# Patient Record
Sex: Male | Born: 1935 | Race: White | Hispanic: No | Marital: Married | Smoking: Former smoker
Health system: Southern US, Community
[De-identification: ages and names within clinical notes are randomized; demographics above are authoritative.]

## PROBLEM LIST (undated history)

## (undated) DIAGNOSIS — Z9889 Other specified postprocedural states: Secondary | ICD-10-CM

## (undated) DIAGNOSIS — I639 Cerebral infarction, unspecified: Secondary | ICD-10-CM

## (undated) DIAGNOSIS — G459 Transient cerebral ischemic attack, unspecified: Secondary | ICD-10-CM

## (undated) DIAGNOSIS — C439 Malignant melanoma of skin, unspecified: Secondary | ICD-10-CM

## (undated) DIAGNOSIS — M1990 Unspecified osteoarthritis, unspecified site: Secondary | ICD-10-CM

## (undated) DIAGNOSIS — R0600 Dyspnea, unspecified: Secondary | ICD-10-CM

## (undated) DIAGNOSIS — I10 Essential (primary) hypertension: Secondary | ICD-10-CM

## (undated) DIAGNOSIS — R112 Nausea with vomiting, unspecified: Secondary | ICD-10-CM

## (undated) DIAGNOSIS — I471 Supraventricular tachycardia, unspecified: Secondary | ICD-10-CM

## (undated) DIAGNOSIS — E039 Hypothyroidism, unspecified: Secondary | ICD-10-CM

## (undated) DIAGNOSIS — H9190 Unspecified hearing loss, unspecified ear: Secondary | ICD-10-CM

## (undated) DIAGNOSIS — G4730 Sleep apnea, unspecified: Secondary | ICD-10-CM

## (undated) DIAGNOSIS — G20A1 Parkinson's disease without dyskinesia, without mention of fluctuations: Secondary | ICD-10-CM

## (undated) DIAGNOSIS — G20 Parkinson's disease: Secondary | ICD-10-CM

## (undated) DIAGNOSIS — I499 Cardiac arrhythmia, unspecified: Secondary | ICD-10-CM

## (undated) DIAGNOSIS — C801 Malignant (primary) neoplasm, unspecified: Secondary | ICD-10-CM

## (undated) HISTORY — PX: THYROID SURGERY: SHX805

## (undated) HISTORY — PX: CATARACT EXTRACTION, BILATERAL: SHX1313

## (undated) HISTORY — PX: KNEE ARTHROSCOPY: SHX127

## (undated) HISTORY — PX: APPENDECTOMY: SHX54

## (undated) HISTORY — PX: TRANSURETHRAL RESECTION OF BLADDER NECK: SHX6196

## (undated) HISTORY — PX: OTHER SURGICAL HISTORY: SHX169

## (undated) HISTORY — PX: HERNIA REPAIR: SHX51

## (undated) HISTORY — PX: TRANSURETHRAL RESECTION OF PROSTATE: SHX73

---

## 1999-07-21 ENCOUNTER — Ambulatory Visit: Admission: RE | Admit: 1999-07-21 | Discharge: 1999-07-21 | Payer: Self-pay | Admitting: Internal Medicine

## 1999-10-27 ENCOUNTER — Ambulatory Visit: Admission: RE | Admit: 1999-10-27 | Discharge: 1999-10-27 | Payer: Self-pay | Admitting: Internal Medicine

## 2007-02-23 ENCOUNTER — Ambulatory Visit: Payer: Self-pay | Admitting: Internal Medicine

## 2007-03-05 ENCOUNTER — Ambulatory Visit: Payer: Self-pay | Admitting: Internal Medicine

## 2009-08-28 ENCOUNTER — Ambulatory Visit: Payer: Self-pay | Admitting: Radiation Oncology

## 2009-09-14 ENCOUNTER — Ambulatory Visit: Payer: Self-pay | Admitting: Radiation Oncology

## 2009-09-27 ENCOUNTER — Ambulatory Visit: Payer: Self-pay | Admitting: Radiation Oncology

## 2009-10-28 ENCOUNTER — Ambulatory Visit: Payer: Self-pay | Admitting: Radiation Oncology

## 2009-11-03 ENCOUNTER — Ambulatory Visit: Payer: Self-pay | Admitting: Radiation Oncology

## 2009-11-24 ENCOUNTER — Ambulatory Visit: Payer: Self-pay | Admitting: Radiation Oncology

## 2009-11-27 ENCOUNTER — Ambulatory Visit: Payer: Self-pay | Admitting: Radiation Oncology

## 2009-12-08 ENCOUNTER — Ambulatory Visit: Payer: Self-pay | Admitting: Urology

## 2009-12-28 ENCOUNTER — Ambulatory Visit: Payer: Self-pay | Admitting: Radiation Oncology

## 2010-01-28 ENCOUNTER — Ambulatory Visit: Payer: Self-pay | Admitting: Radiation Oncology

## 2010-05-13 ENCOUNTER — Ambulatory Visit: Payer: Self-pay

## 2010-06-04 ENCOUNTER — Ambulatory Visit: Payer: Self-pay | Admitting: General Practice

## 2010-06-18 ENCOUNTER — Ambulatory Visit: Payer: Self-pay | Admitting: General Practice

## 2011-06-28 ENCOUNTER — Ambulatory Visit: Payer: Self-pay | Admitting: Ophthalmology

## 2011-06-28 DIAGNOSIS — I10 Essential (primary) hypertension: Secondary | ICD-10-CM

## 2011-07-12 ENCOUNTER — Ambulatory Visit: Payer: Self-pay | Admitting: Ophthalmology

## 2011-08-14 ENCOUNTER — Emergency Department: Payer: Self-pay | Admitting: Emergency Medicine

## 2011-08-14 LAB — COMPREHENSIVE METABOLIC PANEL
Anion Gap: 12 (ref 7–16)
BUN: 16 mg/dL (ref 7–18)
Bilirubin,Total: 0.4 mg/dL (ref 0.2–1.0)
Calcium, Total: 8.7 mg/dL (ref 8.5–10.1)
Chloride: 97 mmol/L — ABNORMAL LOW (ref 98–107)
Co2: 27 mmol/L (ref 21–32)
Creatinine: 0.7 mg/dL (ref 0.60–1.30)
EGFR (African American): >60
EGFR (Non-African Amer.): >60
Osmolality: 274 (ref 275–301)
Potassium: 3.8 mmol/L (ref 3.5–5.1)
SGOT(AST): 22 U/L (ref 15–37)
Sodium: 136 mmol/L (ref 136–145)
Total Protein: 7.2 g/dL (ref 6.4–8.2)

## 2011-08-14 LAB — CBC
HGB: 13.8 g/dL (ref 13.0–18.0)
MCH: 30.9 pg (ref 26.0–34.0)
MCHC: 33.7 g/dL (ref 32.0–36.0)
Platelet: 189 10*3/uL (ref 150–440)
RBC: 4.45 10*6/uL (ref 4.40–5.90)
RDW: 13.7 % (ref 11.5–14.5)
WBC: 4.3 10*3/uL (ref 3.8–10.6)

## 2011-08-14 LAB — TSH: Thyroid Stimulating Horm: 0.61 u[IU]/mL

## 2011-08-14 LAB — TROPONIN I: Troponin-I: <0.02 ng/mL

## 2011-08-14 LAB — MAGNESIUM: Magnesium: 1.7 mg/dL — ABNORMAL LOW

## 2012-03-21 DIAGNOSIS — K649 Unspecified hemorrhoids: Secondary | ICD-10-CM | POA: Insufficient documentation

## 2012-03-21 DIAGNOSIS — R350 Frequency of micturition: Secondary | ICD-10-CM | POA: Insufficient documentation

## 2012-03-21 DIAGNOSIS — Z8546 Personal history of malignant neoplasm of prostate: Secondary | ICD-10-CM | POA: Insufficient documentation

## 2012-12-31 ENCOUNTER — Ambulatory Visit: Payer: Self-pay | Admitting: Surgery

## 2012-12-31 LAB — CBC WITH DIFFERENTIAL/PLATELET
Basophil #: 0 10*3/uL (ref 0.0–0.1)
HCT: 38.9 % — ABNORMAL LOW (ref 40.0–52.0)
Lymphocyte #: 1.3 10*3/uL (ref 1.0–3.6)
Lymphocyte %: 22.5 %
MCH: 31.3 pg (ref 26.0–34.0)
Monocyte #: 0.6 x10 3/mm (ref 0.2–1.0)
Monocyte %: 9.8 %
Neutrophil %: 65.2 %
Platelet: 200 10*3/uL (ref 150–440)
RDW: 13.5 % (ref 11.5–14.5)

## 2012-12-31 LAB — BASIC METABOLIC PANEL
BUN: 16 mg/dL (ref 7–18)
Chloride: 92 mmol/L — ABNORMAL LOW (ref 98–107)
Co2: 29 mmol/L (ref 21–32)
Creatinine: 0.67 mg/dL (ref 0.60–1.30)
EGFR (African American): >60
EGFR (Non-African Amer.): >60
Glucose: 122 mg/dL — ABNORMAL HIGH (ref 65–99)
Osmolality: 258 (ref 275–301)

## 2013-01-07 ENCOUNTER — Ambulatory Visit: Payer: Self-pay | Admitting: Surgery

## 2013-01-07 LAB — POTASSIUM: Potassium: 3.5 mmol/L (ref 3.5–5.1)

## 2013-02-05 ENCOUNTER — Ambulatory Visit: Payer: Self-pay | Admitting: Internal Medicine

## 2013-03-12 ENCOUNTER — Ambulatory Visit: Payer: Self-pay | Admitting: Gastroenterology

## 2013-09-06 DIAGNOSIS — G609 Hereditary and idiopathic neuropathy, unspecified: Secondary | ICD-10-CM | POA: Insufficient documentation

## 2013-09-06 DIAGNOSIS — K4090 Unilateral inguinal hernia, without obstruction or gangrene, not specified as recurrent: Secondary | ICD-10-CM | POA: Insufficient documentation

## 2013-11-13 DIAGNOSIS — E785 Hyperlipidemia, unspecified: Secondary | ICD-10-CM | POA: Insufficient documentation

## 2013-11-13 DIAGNOSIS — G4730 Sleep apnea, unspecified: Secondary | ICD-10-CM | POA: Insufficient documentation

## 2013-11-29 IMAGING — CR DG BARIUM SWALLOW
1 series · 10 of 10 positions shown · non-contrast
Comparison: none

REASON FOR EXAM: dysphagia
COMMENTS:

[Series 1: barium swallow · 10 of 15 frames shown]
[frame 1/15]
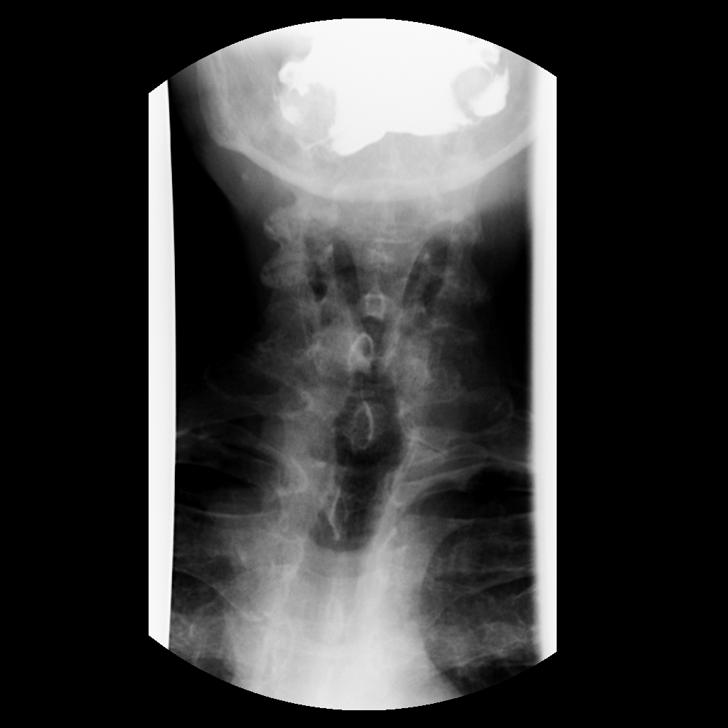
[frame 2/15]
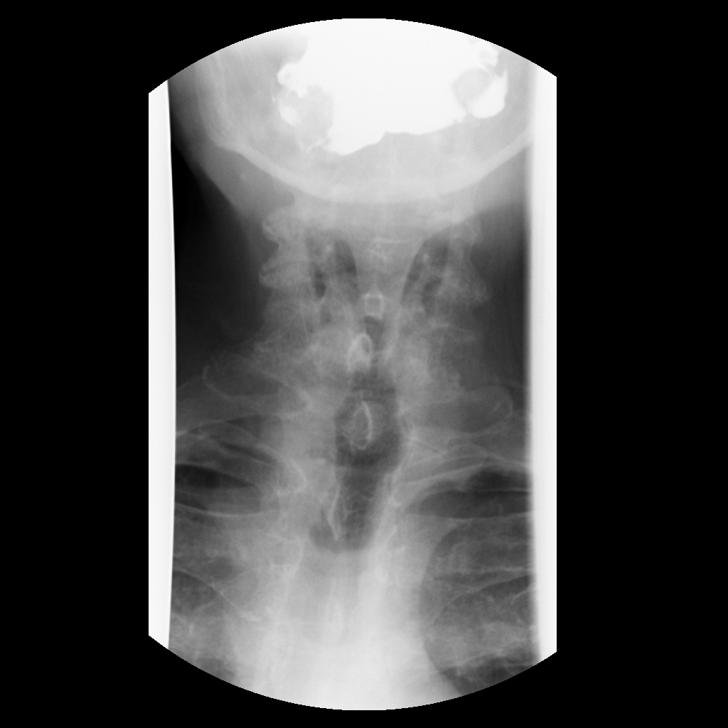
[frame 4/15]
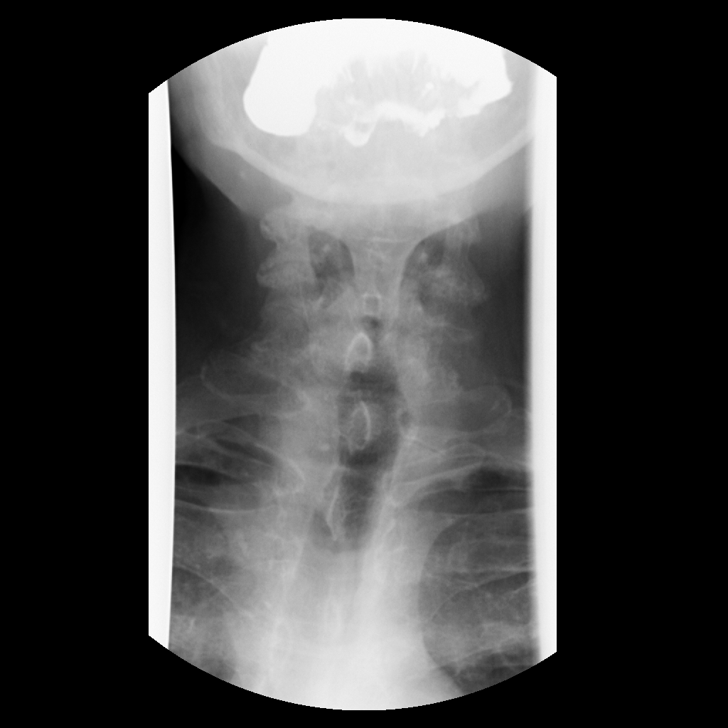
[frame 5/15]
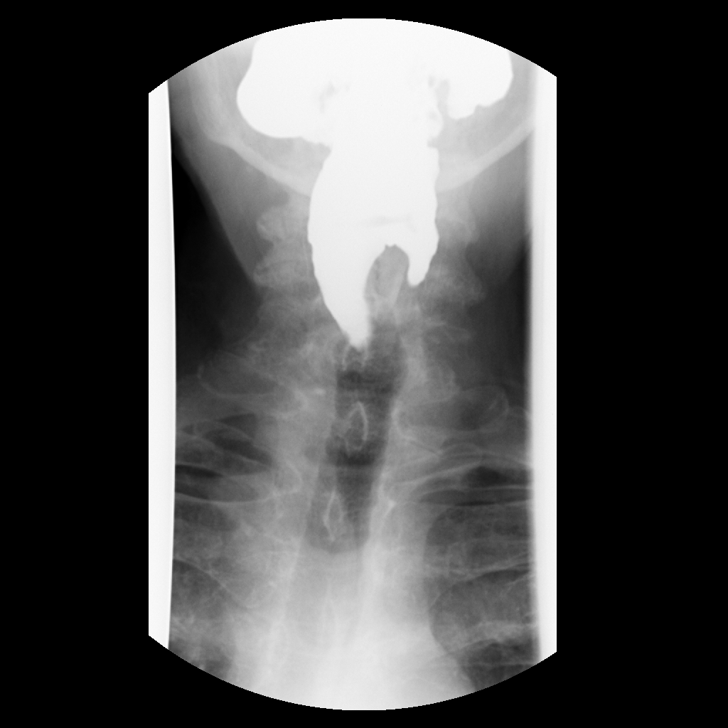
[frame 7/15]
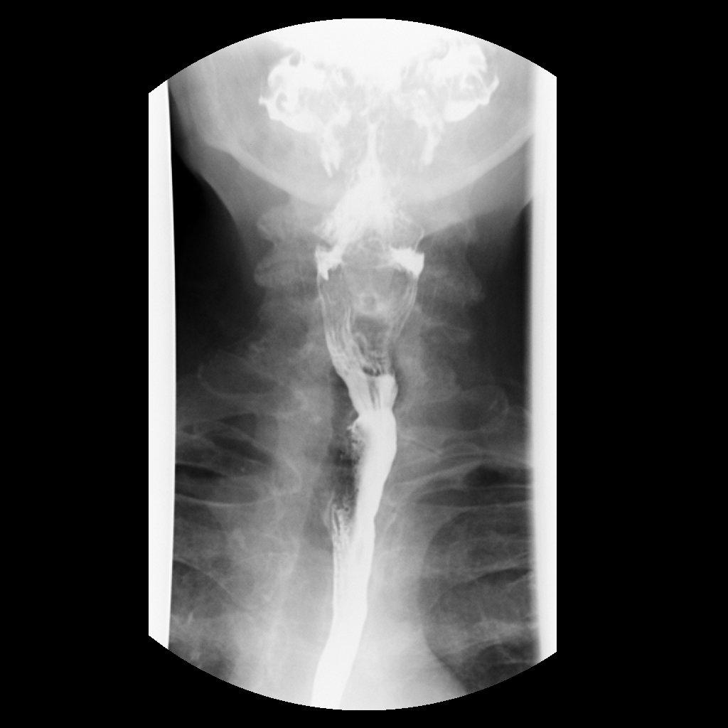
[frame 8/15]
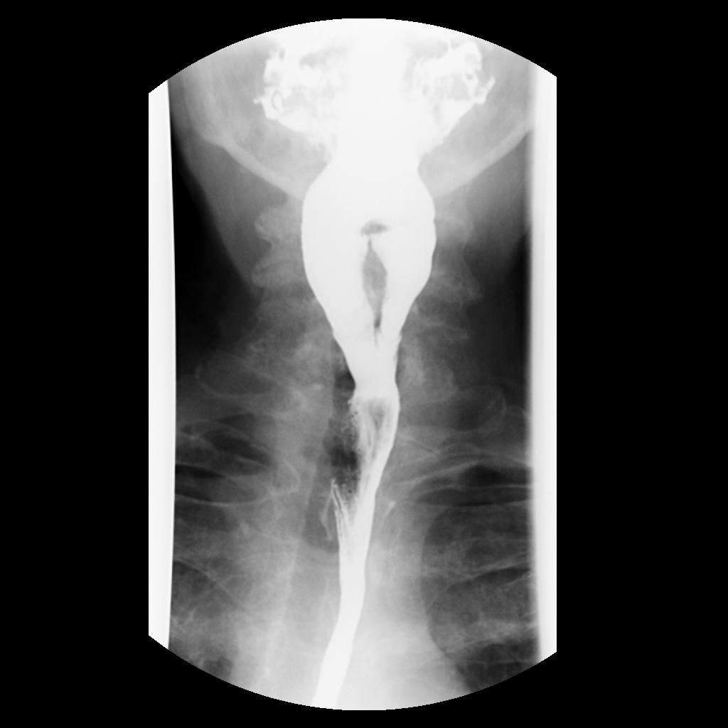
[frame 10/15]
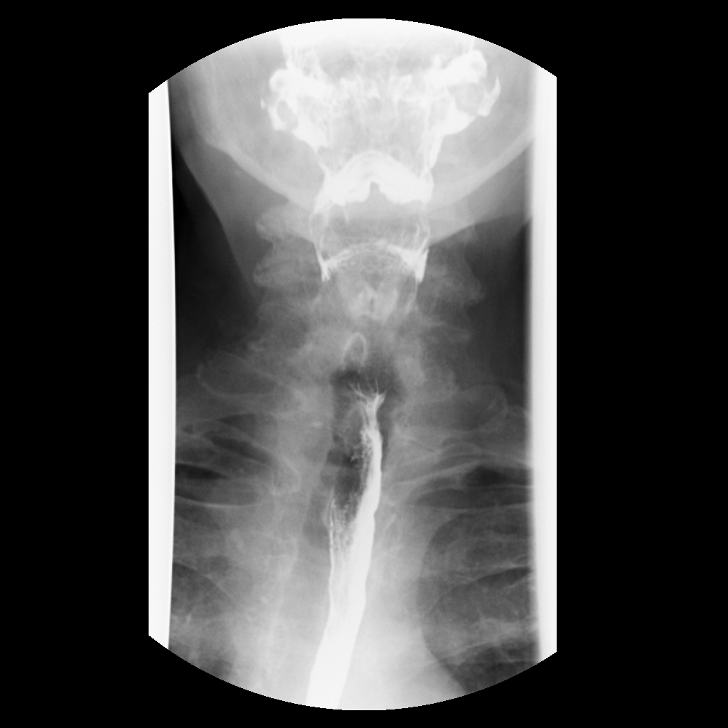
[frame 11/15]
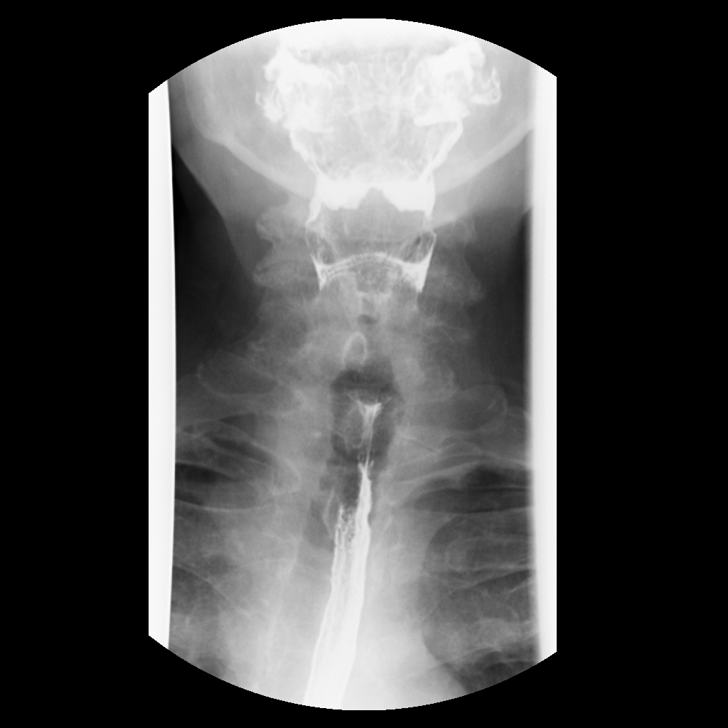
[frame 13/15]
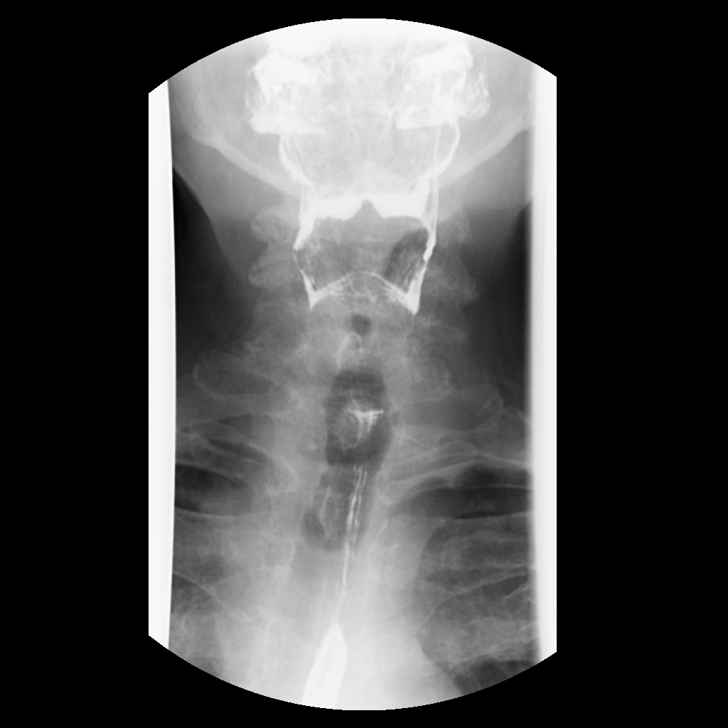
[frame 15/15]
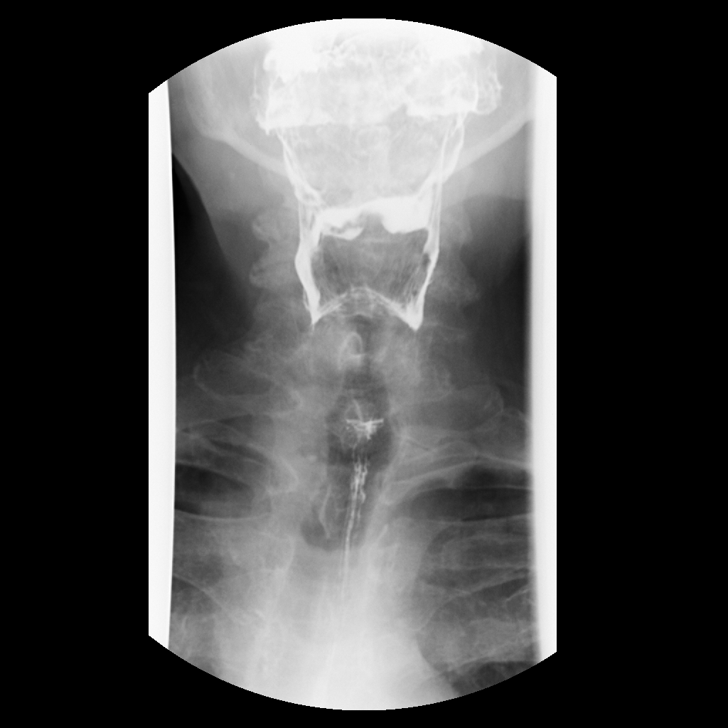

[10 of 10 positions shown; findings below may reference images not displayed]

PROCEDURE:     FL  - FL BARIUM SWALLOW  - February 05, 2013  [DATE]

RESULT:

Procedure:  Dynamic imaging of the cervical esophagus was performed during
active swallowing of barium. This was followed by administration of oral
barium and further evaluation of the thoracic esophagus. Maneuvers were
performed to elicit gastroesophageal reflux. The patient did not receive a
12 mm Locklear secondary to findings on the study.
FINDINGS: The cervical esophagus is unremarkable. There is a moderately
decreased primary stripping wave and to-and-fro motion within the esophagus.
Minimal gastroesophageal reflux was elicited within the lower esophagus. A
moderate sized, sliding, hiatal hernia is identified. A pulsion diverticulum
is appreciated just above the level of the lower esophageal sphincter.
High-grade stenosis is identified within the distal esophagus which appears
to be on the magnitude of 65 to 75% stenosed. A 12.5 mm Locklear was not
administered secondary to the marked distal esophageal stenosis.
IMPRESSION: 1.  High-grade stenosis of the distal esophagus and further evaluation with
direct visualization is recommended.
2.  Moderate esophageal dysmotility.
3.  Pulsion diverticulum along the distal esophagus.
4.  Minimal gastroesophageal reflux.
5.  Moderate sized, sliding, hiatal hernia.

## 2014-02-12 DIAGNOSIS — I10 Essential (primary) hypertension: Secondary | ICD-10-CM | POA: Insufficient documentation

## 2014-02-18 ENCOUNTER — Ambulatory Visit: Payer: Self-pay | Admitting: Ophthalmology

## 2014-04-01 DIAGNOSIS — E034 Atrophy of thyroid (acquired): Secondary | ICD-10-CM | POA: Insufficient documentation

## 2014-07-30 DIAGNOSIS — I471 Supraventricular tachycardia: Secondary | ICD-10-CM | POA: Insufficient documentation

## 2014-09-19 NOTE — Op Note (Signed)
PATIENT NAME:  Duane Vasquez, Duane Vasquez MR#:  [id number] DATE OF BIRTH:  [birth year]  DATE OF PROCEDURE:  01/07/2013  PREOPERATIVE DIAGNOSIS: Right inguinal hernia.   POSTOPERATIVE DIAGNOSIS: Right inguinal hernia.   PROCEDURE: Right inguinal hernia repair.   SURGEON: Rochel Brome, MD  ANESTHESIA: General.   INDICATIONS: This 81 year old male has several year history of right inguinal hernia, but recently has become somewhat larger and more painful having to reduce it multiple times during the course of the day and has recently worn a truss for support. A right inguinal hernia was demonstrated on physical exam and repair was recommended for definitive treatment.   DESCRIPTION OF PROCEDURE: The patient was placed on the operating table in the supine position under general anesthesia. The abdomen was clipped and prepared with ChloraPrep and draped in a sterile manner.   A right lower quadrant transversely oriented suprapubic incision was made, carried down through subcutaneous tissues. One traversing vein was divided between 4-0 chromic suture ligatures. The Scarpa's fascia was incised. The external oblique aponeurosis was incised along the course of its fibers to open the external ring and expose the inguinal cord structures. The ilioinguinal nerve was seen coursing medially and was retracted. The cord structures were mobilized. Cremaster fibers were spread to expose a large indirect hernia sac which was dissected free from surrounding structures. This was a sliding-type hernia consisting of bowel making up a portion of the bowel wall.  One segment of cord lipoma was suture ligated and amputated. The sac was inverted. Next, the floor of the inguinal canal was repaired with a row of 0 Surgilon sutures suturing the conjoined tendon to the shelving edge of the inguinal ligament incorporating transversalis fascia into the repair. The last stitch led to satisfactory narrowing of the internal ring. Next, an onlay  Atrium mesh was cut to create an oval shape of some 2.8 x 4 cm in dimension. On opening was cut to allow the mass to straddle the cord structures. The mesh was inserted and sutured to the repair with 0 Surgilon. Also, the 2 tails were sutured to the fascia with 0 Surgilon, and the mesh was attached medially to the fascia with 0 Surgilon. The repair looked good. Hemostasis was intact. Cord structures and ilioinguinal nerve were replaced along the floor of the inguinal canal. Cut edges of the external oblique aponeurosis were closed with a running 4-0 Vicryl. The deep fascia superior and lateral to the repair site was infiltrated with 0.5% Sensorcaine with epinephrine. Subcutaneous tissues were infiltrated as well. A total of 15 mL was used. Next, the Scarpa's fascia was closed with 4-0 Vicryl and 4-0 Monocryl. Next, the skin was closed with running 4-0 Monocryl subcuticular suture and Dermabond. The patient tolerated surgery satisfactorily and was then prepared for transfer to the recovery room.  ____________________________ Lenna Sciara. Rochel Brome, MD jws:sb D: 01/07/2013 11:14:58 ET T: 01/07/2013 12:18:18 ET JOB#: [id number]  cc: Loreli Dollar, MD, <Dictator> Loreli Dollar MD ELECTRONICALLY SIGNED 01/08/2013 15:58

## 2014-09-20 NOTE — Op Note (Signed)
PATIENT NAME:  Duane Vasquez, Duane Vasquez MR#:  [id number] DATE OF BIRTH:  [birth year]  DATE OF PROCEDURE:  02/18/2014  PREOPERATIVE DIAGNOSIS: Visually significant cataract of the left eye.   POSTOPERATIVE DIAGNOSIS: Visually significant cataract of the left eye.   OPERATIVE PROCEDURE: Cataract extraction by phacoemulsification with implant of intraocular lens to left eye.   SURGEON: Birder Robson, MD.   ANESTHESIA:  1. Managed anesthesia care.  2. Topical tetracaine drops followed by 2% Xylocaine jelly applied in the preoperative holding area.   COMPLICATIONS: None.   TECHNIQUE:  Stop and chop.   DESCRIPTION OF PROCEDURE: The patient was examined and consented in the preoperative holding area where the aforementioned topical anesthesia was applied to the left eye and then brought back to the operating room where the left eye was prepped and draped in the usual sterile ophthalmic fashion and a lid speculum was placed. A paracentesis was created with the side port blade and the anterior chamber was filled with viscoelastic. A near clear corneal incision was performed with the steel keratome. A continuous curvilinear capsulorrhexis was performed with a cystotome followed by the capsulorrhexis forceps. Hydrodissection and hydrodelineation were carried out with BSS on a blunt cannula. The lens was removed in a stop-and-chop technique and the remaining cortical material was removed with the irrigation-aspiration handpiece. The capsular bag was inflated with viscoelastic and the Tecnis ZCB00, 22.5-diopter lens, serial number [id number] was placed in the capsular bag without complication. The remaining viscoelastic was removed from the eye with the irrigation-aspiration handpiece. The wounds were hydrated. The anterior chamber was flushed with Miostat and the eye was inflated to physiologic pressure; 0.1 mL of cefuroxime concentration 10 mg/mL was placed in the anterior chamber. The wounds were found to be water  tight. The eye was dressed with Vigamox. The patient was given protective glasses to wear throughout the day and a shield with which to sleep tonight. The patient was also given drops with which to begin a drop regimen today and will follow up with me in one day.    ____________________________ Livingston Diones. Rakia Frayne, MD wlp:ST D: 02/18/2014 21:08:00 ET T: 02/18/2014 21:50:19 ET JOB#: [id number]  cc: Mykeal Carrick L. Danish Ruffins, MD, <Dictator> Livingston Diones Leeroy Lovings MD ELECTRONICALLY SIGNED 02/19/2014 10:43

## 2014-09-21 NOTE — Op Note (Signed)
PATIENT NAME:  Duane Vasquez, Duane Vasquez MR#:  [id number] DATE OF BIRTH:  1925/06/17  DATE OF PROCEDURE:  07/12/2011  PREOPERATIVE DIAGNOSIS: Visually significant cataract of the right eye.   POSTOPERATIVE DIAGNOSIS: Visually significant cataract of the right eye.   OPERATIVE PROCEDURE: Cataract extraction by phacoemulsification with implant of intraocular lens to the right eye.   SURGEON: Birder Robson, MD.   ANESTHESIA:  1. Managed anesthesia care.  2. Topical tetracaine drops followed by 2% Xylocaine jelly applied in the preoperative holding area.   COMPLICATIONS: None.   TECHNIQUE:  Stop and chop.   DESCRIPTION OF PROCEDURE: The patient was examined and consented in the preoperative holding area where the aforementioned topical anesthesia was applied to the right eye and then brought back to the Operating Room where the right eye was prepped and draped in the usual sterile ophthalmic fashion and a lid speculum was placed. A paracentesis was created with the side port blade and the anterior chamber was filled with viscoelastic. A near clear corneal incision was performed with the steel keratome. A continuous curvilinear capsulorrhexis was performed with a cystotome followed by the capsulorrhexis forceps. Hydrodissection and hydrodelineation were carried out with BSS on a blunt cannula. The lens was removed in a stop and chop technique and the remaining cortical material was removed with the irrigation-aspiration handpiece. The capsular bag was inflated with viscoelastic and the ZCB00 22-diopter lens, serial number [id number] was placed in the capsular bag without complication. The remaining viscoelastic was removed from the eye with the irrigation-aspiration handpiece. The wounds were hydrated. The anterior chamber was flushed with Miostat and the eye was inflated to physiologic pressure. The wounds were found to be water tight. The eye was dressed with Vigamox and Omnipred. The patient was given protective  glasses to wear throughout the day and a shield with which to sleep tonight. The patient was also given drops with which to begin a drop regimen today and will follow up with me in one day.    ____________________________ Livingston Diones. Yolando Gillum, MD wlp:bjt D: 07/12/2011 21:45:14 ET T: 07/13/2011 09:08:08 ET JOB#: [id number]  cc: Courtenay Creger L. Chelsae Zanella, MD, <Dictator> Livingston Diones Linden Mikes MD ELECTRONICALLY SIGNED 07/19/2011 12:26

## 2014-10-02 DIAGNOSIS — E559 Vitamin D deficiency, unspecified: Secondary | ICD-10-CM | POA: Insufficient documentation

## 2014-10-02 DIAGNOSIS — I6529 Occlusion and stenosis of unspecified carotid artery: Secondary | ICD-10-CM | POA: Insufficient documentation

## 2014-10-18 DIAGNOSIS — H35712 Central serous chorioretinopathy, left eye: Secondary | ICD-10-CM | POA: Insufficient documentation

## 2014-10-18 DIAGNOSIS — D329 Benign neoplasm of meninges, unspecified: Secondary | ICD-10-CM | POA: Insufficient documentation

## 2014-10-21 ENCOUNTER — Other Ambulatory Visit: Payer: Self-pay | Admitting: Neurology

## 2014-10-21 DIAGNOSIS — R2990 Unspecified symptoms and signs involving the nervous system: Secondary | ICD-10-CM

## 2014-10-21 DIAGNOSIS — D329 Benign neoplasm of meninges, unspecified: Secondary | ICD-10-CM

## 2014-10-28 ENCOUNTER — Ambulatory Visit
Admission: RE | Admit: 2014-10-28 | Discharge: 2014-10-28 | Disposition: A | Discharge status: Home / Self Care | Payer: Medicare Other | Source: Ambulatory Visit | Attending: Neurology | Admitting: Neurology

## 2014-10-28 DIAGNOSIS — G3189 Other specified degenerative diseases of nervous system: Secondary | ICD-10-CM | POA: Insufficient documentation

## 2014-10-28 DIAGNOSIS — I679 Cerebrovascular disease, unspecified: Secondary | ICD-10-CM | POA: Diagnosis not present

## 2014-10-28 DIAGNOSIS — D329 Benign neoplasm of meninges, unspecified: Secondary | ICD-10-CM | POA: Insufficient documentation

## 2014-10-28 DIAGNOSIS — R2990 Unspecified symptoms and signs involving the nervous system: Secondary | ICD-10-CM | POA: Diagnosis present

## 2014-10-28 MED ORDER — GADOBENATE DIMEGLUMINE 529 MG/ML IV SOLN
15.0000 mL | Freq: Once | INTRAVENOUS | Status: AC | PRN
Start: 2014-10-28 — End: 2014-10-28
  Administered 2014-10-28: 15 mL via INTRAVENOUS

## 2015-05-20 DIAGNOSIS — G20 Parkinson's disease: Secondary | ICD-10-CM | POA: Insufficient documentation

## 2015-07-14 DIAGNOSIS — M5416 Radiculopathy, lumbar region: Secondary | ICD-10-CM | POA: Insufficient documentation

## 2015-07-14 DIAGNOSIS — M5136 Other intervertebral disc degeneration, lumbar region: Secondary | ICD-10-CM | POA: Insufficient documentation

## 2015-10-02 ENCOUNTER — Ambulatory Visit: Payer: Medicare Other | Attending: Gastroenterology | Admitting: Physical Therapy

## 2015-10-02 DIAGNOSIS — R279 Unspecified lack of coordination: Secondary | ICD-10-CM

## 2015-10-02 DIAGNOSIS — M5441 Lumbago with sciatica, right side: Secondary | ICD-10-CM | POA: Diagnosis present

## 2015-10-02 DIAGNOSIS — R2689 Other abnormalities of gait and mobility: Secondary | ICD-10-CM | POA: Diagnosis present

## 2015-10-02 NOTE — Patient Instructions (Addendum)
    DECREASE bladder irritants and increase water gradually  (remove tea, keep 2 cups coffee and 1-16 fl oz gatorade) and increase room temperature water to 2 (12 fl oz) 3:2 ratio  Next week try one less coffee, (1 coffee and 1 gatorade),  (cup 2 servings of water)  (12 fl oz)  2:2  Ratio  Speak to your doctor about sodium/ gatorade before changing your Gatorade intake      __________________________    Getting comfortable on your bed:  Log roll into and out of bed    Lift hips and lower your back segmentally from top ribs, lower ribs, pelvis last

## 2015-10-02 NOTE — Therapy (Addendum)
[place] MAIN [facility] SERVICES [address] [place], [place], [address] Phone: [phone]   Fax:  [phone]  Physical Therapy Evaluation  Patient Details  Name: Duane Vasquez MRN: [id number] Date of Birth: [birth year] Referring Provider: Rayann Heman, MD  Encounter Date: 10/02/2015      PT End of Session - 11/02/15 1051    Visit Number 1   Number of Visits 12   Date for PT Re-Evaluation 12/25/15   Authorization Type 1/10 g code   Activity Tolerance Patient tolerated treatment well;No increased pain   Behavior During Therapy [facility] for tasks assessed/performed      No past medical history on file.  No past surgical history on file.  There were no vitals filed for this visit.       Subjective Assessment - 11/02/15 1045    Subjective 1) chronic constipation in his child and adult life. Last year, pt relied on Mg to eliminate bowels every 3-4 days. Pt depends on Miralax for daily  bowel movements, Type 5-6. Pt feels he is improving with more regular movements.  No current hemorrhoids. He reports hs has a problem with fully emptying bowels.    2) Urinary urge and frequency for the past months.   Daily fluid: 2 cups of coffee, 1 glass tea, 16 fl Gatorade  , 12 fl oz of water.   Nocturia 3-4 x.  Pt has sleep apnea but not currently using CPAP.     3) constant CLBP localized in the middle of the spine. At its worse 8/10. R > L. Pain interrupts his walking.  Radiating pain to R posterior thigh with increased activity. Frequency of pain: 3x week and dissipates within one hour.  R knee pain is present as well . Hx of back injury at the age of 55.  Pt had a spinal injections in Jan, Feb, 4/28 which has helped a moderate amount of improvement.                    Pertinent History Hx of parkinson 2016, prostate CA (radiation seed impant) 2011            [facility] PT Assessment - 11/02/15 1042    Assessment   Medical Diagnosis Constipation, pelvic floor  dysfunction   Referring Provider Rayann Heman, MD   Precautions   Precautions None   Restrictions   Weight Bearing Restrictions No   Balance Screen   Has the patient fallen in the past 6 months No   Prior Function   Level of Independence Independent with household mobility with device  rollator   Observation/Other Assessments   Observations forward head, slouched posture, scoliosis (R thoracolumbar)   uses rollator   Other Surveys  --  ODI 425, COREFO 18%    Sit to Stand   Comments 5TST 23.28 m/s without arm rests    AROM   Overall AROM Comments R knee contracture 35 deg flexion, L 25 flexion    Palpation   Palpation comment L iliac crest higher in standing    Ambulation/Gait   Gait Comments 0.6 m/s w/ rollator   noted clicking sound on R                    [facility] Adult PT Treatment/Exercise - 11/02/15 1105    Self-Care   Self-Care --  see pt instructions   Therapeutic Activites    Therapeutic Activities --  see pt instructions   Neuro Re-ed  Neuro Re-ed Details  --  see pt instructions                PT Education - 11/02/15 1028    Education provided Yes   Education Details POC, anatomy, physiology, goals   Person(s) Educated Patient   Methods Explanation;Demonstration;Tactile cues;Verbal cues;Handout   Comprehension Returned demonstration;Verbalized understanding             PT Long Term Goals - 10/02/15 1448    PT LONG TERM GOAL #1   Title Pt will be compliant with bladder irritant and water intake modifications (removing a cup of tea/ day and increasing wate ro 2 servings of 12 fl oz)  in order to decrease urinayr frequency.   Time 12   Period Weeks   Status New   PT LONG TERM GOAL #2   Title Pt will report improved stool consistency from Type 5-6 to 3-4 from 75% of the time in order to restore GI / pelvic floor function.    Time 12   Period Weeks   Status New   PT LONG TERM GOAL #3   Title Pt will report being able to sleep on his  back across two nights in order to remain compliant with use of CPAP machine.    Time 12   Period Weeks   Status New   PT LONG TERM GOAL #4   Title Pt will decrease his 5STST from 23.28 s to < 19 s  without arm rests on chair in order perform ADLs and decrease LBP.    Time 12   Period Weeks   Status New   PT LONG TERM GOAL #5   Title Pt will report compliance to using his CPAP machine in order to decrease nocturia and improve sleep quality   Time 12   Period Weeks   Status New   Additional Long Term Goals   Additional Long Term Goals Yes   PT LONG TERM GOAL #6   Title Pt will decrease his ODI score from 42% to < 30% in order to perform ADLs.    Time 12   Period Weeks   Status New   PT LONG TERM GOAL #7   Title Pt will decrease his COREFO score from 18% to < 105 in order to improve colorectal function and have better bowel movements.    Time 12   Period Weeks   Status New               Plan - 11/02/15 1051    Clinical Impression Statement Pt is an 82 yo male who c/o constipation, urinary urge and frequency, nocturia,  and chronic LBP with radiating pain to R posterior thigh.  Pt's clinical presentations included scoliotic curve and pelvic obliquities due to R knee contracture 2/2 Parkinson's, slow gait speed with rollator and and decreased 5 times sit to stand , poor toileting and bladder health education, and poor adherence to use of CPAP.  These deficits put pt at risk for falls, impact his ADLs and QOL.     Patient will benefit from skilled therapeutic intervention in order to improve the following deficits and impairments:  Abnormal gait, Decreased strength, Increased muscle spasms, Improper body mechanics, Decreased activity tolerance, Decreased balance, Decreased coordination, Decreased endurance, Pain, Decreased safety awareness, Hypomobility, Decreased range of motion, Decreased mobility, Impaired flexibility, Difficulty walking, Postural dysfunction    Rehab Potential  Good   Clinical Impairments Affecting Rehab Potential Hx of spinal injury during childhood,  Parkinsons, prostatectomy, OSA without compliant use of CPAP   PT Frequency 1x / week   PT Duration 12 weeks   PT Treatment/Interventions ADLs/Self Care Home Management;Aquatic Therapy;Biofeedback;Cryotherapy;Electrical Stimulation;Moist Heat;Traction;Patient/family education;Balance training;Therapeutic exercise;Therapeutic activities;Functional mobility training;Stair training;Gait training;Manual techniques;Scar mobilization;Passive range of motion;Neuromuscular re-education;Energy conservation;Taping;Dry needling   Consulted and Agree with Plan of Care Patient      Patient will benefit from skilled therapeutic intervention in order to improve the following deficits and impairments:  Abnormal gait, Decreased strength, Increased muscle spasms, Improper body mechanics, Decreased activity tolerance, Decreased balance, Decreased coordination, Decreased endurance, Pain, Decreased safety awareness, Hypomobility, Decreased range of motion, Decreased mobility, Impaired flexibility, Difficulty walking, Postural dysfunction  Visit Diagnosis: Other abnormalities of gait and mobility  Unspecified lack of coordination  Midline low back pain with right-sided sciatica     Problem List There are no active problems to display for this patient.   Jerl Mina ,PT, DPT, E-RYT  11/02/2015, 11:05 AM  [place] MAIN [facility] SERVICES [address] [place], [place], [address] Phone: [phone]   Fax:  [phone]  Name: Duane Vasquez MRN: [id number] Date of Birth: 1925-06-01

## 2015-10-14 ENCOUNTER — Encounter: Payer: BLUE CROSS/BLUE SHIELD | Admitting: Physical Therapy

## 2015-11-02 ENCOUNTER — Ambulatory Visit: Payer: Medicare Other | Attending: Gastroenterology | Admitting: Physical Therapy

## 2015-11-02 DIAGNOSIS — M5441 Lumbago with sciatica, right side: Secondary | ICD-10-CM | POA: Diagnosis present

## 2015-11-02 DIAGNOSIS — R279 Unspecified lack of coordination: Secondary | ICD-10-CM | POA: Diagnosis not present

## 2015-11-02 DIAGNOSIS — R2689 Other abnormalities of gait and mobility: Secondary | ICD-10-CM | POA: Insufficient documentation

## 2015-11-02 NOTE — Patient Instructions (Addendum)
Coordinate breathing with getting up out of the chair: Inhale, noes over toes,  Exhale push up to stand    REGULAR MOVEMENT ROUTINE:  Alternate between regular chair for 30 min,  Other 30 min in a recliner    Inhale , do nothing Exhale, lift leg up with strap under [place] of foot--> place onto foot stool   Take 3 breaths pulling strap and bringing toes towards you like you are letting off a gas petal    exercise: 10 x 2 sets on R, 10 x 1 set on L) for hip flexor strengthening and hamstring stretch _________________   Seated marches for hip strengthening  10 x 3 both legs marching while breathing    _________________  With rollator:  with hands support on rollator  During commericals   Toe tap behind one on exhale  while balancing on one leg 10 x on each leg   ___________  Partial squats w/ hands supported on rollator  Feet are wider than hips, Inhale, scoot buttocks back like you are about to ist in a chair, knees are behind toes to not strain the knees  Exhale, stand up, and look ahead with back straight  10 x

## 2015-11-03 NOTE — Therapy (Signed)
[place] MAIN [facility] SERVICES [address] [place], [place], [address] Phone: [phone]   Fax:  [phone]  Physical Therapy Treatment  Patient Details  Name: Duane Vasquez MRN: [id number] Date of Birth: 1925/07/15 Referring Provider: Rayann Heman, MD  Encounter Date: 11/02/2015      PT End of Session - 11/03/15 1332    Visit Number 2   Number of Visits 12   Date for PT Re-Evaluation 12/25/15   Authorization Type 2/10 g code   PT Start Time 1502   PT Stop Time [id number]   PT Time Calculation (min) 68 min   Activity Tolerance Patient tolerated treatment well;No increased pain   Behavior During Therapy [facility] for tasks assessed/performed      No past medical history on file.  No past surgical history on file.  There were no vitals filed for this visit.      Subjective Assessment - 11/02/15 1519    Subjective Pt has tried to increase his water intake and has decreased his coffee to 1 cup/ day, and also decreased tea as well.  Pt reports his R knee continues to hurt more than his L. Pt will be getting an X-ray this week on his knee and potentially get 3 consecutive shots to address his pain. Pt reports being more stiff today due to lack of inactivity and increased pain which has caused him to to be less active. 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  Pt was not able to practice his HEP due to mm spasms. Pt has not been sleeping regularly in his bed due pain and has been sleeping in his recliner. Pt has returned using his CPAP.    Pertinent History Hx of parkinson 2016, prostate CA (radiation seed impant) 2011            [facility] PT Assessment - 11/03/15 1331    Strength   Overall Strength Comments hip flexion R 4-/5, L 4+/5, knee flexion/ext 5/5, DF/PF 5/5 (seated)    Ambulation/Gait   Gait Comments no clicking sound on the R                      [facility] Adult PT Treatment/Exercise -  11/03/15 1336    Therapeutic Activites    Therapeutic Activities --  discussed adding fiber gradually into diet, POC   Exercises   Exercises --  see pt instructions        Neuromuscular Education:                                                 Breathing coordination with against gravity and exertional movements in HEP and sit to stand t/f             PT Education - 11/03/15 1332    Education provided Yes   Education Details HEP   Person(s) Educated Patient   Methods Explanation;Demonstration;Tactile cues;Verbal cues;Handout   Comprehension Returned demonstration;Verbalized understanding             PT Long Term Goals - 10/02/15 1448    PT LONG TERM GOAL #1   Title Pt will be compliant with bladder irritant and water intake modifications (removing a cup of tea/ day and increasing wate ro 2 servings of 12 fl oz)  in order to decrease urinayr frequency.   Time 12   Period Weeks   Status New   PT LONG TERM GOAL #2   Title Pt will report improved stool consistency from Type 5-6 to 3-4 from 75% of the time in order to restore GI / pelvic floor function.    Time 12   Period Weeks   Status New   PT LONG TERM GOAL #3   Title Pt will report being able to sleep on his back across two nights in order to remain compliant with use of CPAP machine.    Time 12   Period Weeks   Status New   PT LONG TERM GOAL #4   Title Pt will decrease his 5STST from 23.28 s to < 19 s  without arm rests on chair in order perform ADLs and decrease LBP.    Time 12   Period Weeks   Status New   PT LONG TERM GOAL #5   Title Pt will report compliance to using his CPAP machine in order to decrease nocturia and improve sleep quality   Time 12   Period Weeks   Status New   Additional Long Term Goals   Additional Long Term Goals Yes   PT LONG TERM GOAL #6   Title Pt will decrease his ODI score from 42% to < 30% in order to perform ADLs.    Time 12   Period Weeks   Status New   PT LONG TERM  GOAL #7   Title Pt will decrease his COREFO score from 18% to < 105 in order to improve colorectal function and have better bowel movements.    Time 12   Period Weeks   Status New               Plan - 11/03/15 1335    Clinical Impression Statement Pt shows compliance with decreasing bladder irritants and is working to increase water intake gradually. Discussed adding steamed veggies, fruits, oatmeal  gradually into his diet as his food diary indicated he ate more dairy products/ processed foods than natural sources of fiber. Pt voiced understanding.   Pt was educated on a HEP to help pt stay flexible and less sedentary in his recliner. Pt learned how to coordinate breathing into his exercises and sit to stand t/f in order to minimize strain on pelvic floor mm. Pt demo'd correctly.  Pt would like to return using his recumbent bike but has back pain when using it. Plan to educate pt on modified use of recumbent bike in PT gym at next session in addition to pelvic floor exercises. Discussed pt may benefit from home PT after d/c from pelvic health PT and may also benefit from working w/ PT who is specialised with Parkinson's pts to address bed mobility so pt can return to sleeping in his bed instead of his recliner and to also remain complaint to CPAP use.  Pt will voiced understanding. Pt continues to benefit from skilled PT.      Rehab Potential Good   Clinical Impairments Affecting Rehab Potential Hx of spinal injury during childhood, Parkinsons, prostatectomy, OSa without compliant use of CPAP   PT Frequency 1x / week   PT Duration 12 weeks   PT Treatment/Interventions ADLs/Self Care Home Management;Aquatic Therapy;Biofeedback;Cryotherapy;Electrical Stimulation;Moist Heat;Traction;Patient/family education;Balance training;Therapeutic exercise;Therapeutic activities;Functional mobility training;Stair training;Gait training;Manual techniques;Scar mobilization;Passive range of motion;Neuromuscular  re-education;Energy conservation;Taping;Dry needling   Consulted and Agree with Plan of Care Patient  Patient will benefit from skilled therapeutic intervention in order to improve the following deficits and impairments:  Abnormal gait, Decreased strength, Increased muscle spasms, Improper body mechanics, Decreased activity tolerance, Decreased balance, Decreased coordination, Decreased endurance, Pain, Decreased safety awareness, Hypomobility, Decreased range of motion, Decreased mobility, Impaired flexibility, Difficulty walking, Postural dysfunction  Visit Diagnosis: Unspecified lack of coordination  Other abnormalities of gait and mobility  Midline low back pain with right-sided sciatica     Problem List There are no active problems to display for this patient.   Jerl Mina ,PT, DPT, E-RYT  11/03/2015, 1:48 PM  [place] MAIN [facility] SERVICES [address] [place], [place], [address] Phone: [phone]   Fax:  [phone]  Name: Duane Vasquez MRN: [id number] Date of Birth: 25-Sep-1925

## 2015-11-09 ENCOUNTER — Ambulatory Visit: Payer: Medicare Other | Admitting: Physical Therapy

## 2015-11-09 DIAGNOSIS — R279 Unspecified lack of coordination: Secondary | ICD-10-CM

## 2015-11-09 DIAGNOSIS — R2689 Other abnormalities of gait and mobility: Secondary | ICD-10-CM

## 2015-11-09 DIAGNOSIS — M5441 Lumbago with sciatica, right side: Secondary | ICD-10-CM

## 2015-11-10 NOTE — Patient Instructions (Signed)
Handout on body mechanics  Tandem stance, perpendicular placement of trunk to counter when cleaning counter  Tandem stance, decrease spinal flexion when cleaning toilet    Bed mobility techniques as practiced, Use of pillows behind back, between knees, in sidelying position

## 2015-11-10 NOTE — Therapy (Signed)
[place] MAIN [facility] SERVICES [address] [place], [place], [address] Phone: [phone]   Fax:  [phone]  Physical Therapy Treatment  Patient Details  Name: Duane Vasquez MRN: [id number] Date of Birth: 1926-03-20 Referring Provider: Rayann Heman, MD  Encounter Date: 11/09/2015      PT End of Session - 11/10/15 2121    Visit Number 3   Number of Visits 12   Date for PT Re-Evaluation 12/25/15   Authorization Type 3/10 g code   PT Start Time 1505   PT Stop Time 1610   PT Time Calculation (min) 65 min   Activity Tolerance Patient tolerated treatment well;No increased pain   Behavior During Therapy [facility] for tasks assessed/performed      No past medical history on file.  No past surgical history on file.  There were no vitals filed for this visit.      Subjective Assessment - 11/10/15 2114    Subjective Pt reported he received a cortisone shot on his knee last week. Pt has tried to perform his HEP but would like to perform it more frequently. Pt feels the exercise with the strap will help his muscles.    Pertinent History Hx of parkinson 2016, prostate CA (radiation seed impant) 2011            [facility] PT Assessment - 11/10/15 2115    Observation/Other Assessments   Observations good carry over with less breathholding during sit to stand   Other:   Other/ Comments bending technique to clean toilet, counter with spinal fleixion. p!    Bed Mobility   Supine to Sit --  able to perform log rolling w/ hooked foot technique   Sit to Sidelying Left --  able to use forearms                     [facility] Adult PT Treatment/Exercise - 11/10/15 2115    Therapeutic Activites    Therapeutic Activities --  body mechanics with functional tasks, less p! reported   Neuro Re-ed    Neuro Re-ed Details  strategies to decrease spinal flexion in functional tasks, bed mobility                PT Education - 11/10/15 2121     Education provided Yes   Education Details HEP   Person(s) Educated Patient   Methods Explanation;Demonstration;Tactile cues;Verbal cues;Handout   Comprehension Returned demonstration;Verbalized understanding             PT Long Term Goals - 11/09/15 1516    PT LONG TERM GOAL #1   Title (p) Pt will be compliant with bladder irritant and water intake modifications (removing a cup of tea/ day and increasing wate ro 2 servings of 12 fl oz)  in order to decrease urinayr frequency.   Time (p) 12   Period (p) Weeks   Status (p) Achieved   PT LONG TERM GOAL #2   Title (p) Pt will report improved stool consistency from Type 5-6 to 3-4 from 75% of the time in order to restore GI / pelvic floor function.    Time (p) 12   Period (p) Weeks   Status (p) Partially Met   PT LONG TERM GOAL #3   Title (p) Pt will report being able to sleep on his back across two nights in order to remain compliant with use of CPAP machine.    Time (p) 12   Period (p) Weeks  Status (p) On-going   PT LONG TERM GOAL #4   Title (p) Pt will decrease his 5STST from 23.28 s to < 19 s  without arm rests on chair in order perform ADLs and decrease LBP.    Time (p) 12   Period (p) Weeks   Status (p) New   PT LONG TERM GOAL #5   Title (p) Pt will report compliance to using his CPAP machine in order to decrease nocturia and improve sleep quality   Time (p) 12   Period (p) Weeks   Status (p) Achieved   PT LONG TERM GOAL #6   Title (p) Pt will decrease his ODI score from 42% to < 30% in order to perform ADLs.    Time (p) 12   Period (p) Weeks   Status (p) On-going   PT LONG TERM GOAL #7   Title (p) Pt will decrease his COREFO score from 18% to < 105 in order to improve colorectal function and have better bowel movements.    Time (p) 12   Period (p) Weeks   Status (p) On-going               Plan - 11/10/15 2122    Clinical Impression Statement Pt demo'd proper body mechanics with functional tasks  (cleaning counter, toilet) and bed mobility techniques. Pt reported less pain with use of techniques compared to his self-selected techniques. Today's focus was emphasized to promote functional mobility with household tasks with less pain, compliant use of CPAP moachine, sleeping in bed comfortably instead of sleeping in recliner. Plan to address pelvic floor coordination at next session. Pt continues to benefit from skilled PT.    Rehab Potential Good   Clinical Impairments Affecting Rehab Potential Hx of spinal injury during childhood, Parkinsons, prostatectomy, OSa without compliant use of CPAP   PT Frequency 1x / week   PT Duration 12 weeks   PT Treatment/Interventions ADLs/Self Care Home Management;Aquatic Therapy;Biofeedback;Cryotherapy;Electrical Stimulation;Moist Heat;Traction;Patient/family education;Balance training;Therapeutic exercise;Therapeutic activities;Functional mobility training;Stair training;Gait training;Manual techniques;Scar mobilization;Passive range of motion;Neuromuscular re-education;Energy conservation;Taping;Dry needling   Consulted and Agree with Plan of Care Patient      Patient will benefit from skilled therapeutic intervention in order to improve the following deficits and impairments:  Abnormal gait, Decreased strength, Increased muscle spasms, Improper body mechanics, Decreased activity tolerance, Decreased balance, Decreased coordination, Decreased endurance, Pain, Decreased safety awareness, Hypomobility, Decreased range of motion, Decreased mobility, Impaired flexibility, Difficulty walking, Postural dysfunction  Visit Diagnosis: No diagnosis found.     Problem List There are no active problems to display for this patient.   Jerl Mina ,PT, DPT, E-RYT  11/10/2015, 9:26 PM  [place] MAIN [facility] SERVICES [address] [place], [place], [address] Phone: [phone]   Fax:  [phone]  Name: GREY RAKESTRAW MRN: [id number] Date of Birth: [birth year]

## 2015-11-18 ENCOUNTER — Ambulatory Visit: Payer: Medicare Other | Admitting: Physical Therapy

## 2015-11-23 ENCOUNTER — Ambulatory Visit: Payer: Medicare Other | Admitting: Physical Therapy

## 2015-11-23 ENCOUNTER — Telehealth: Payer: Self-pay | Admitting: Physical Therapy

## 2015-11-23 NOTE — Telephone Encounter (Signed)
PT called pt to f/u on his request to call him re: missed appts. Pt explains he has had weakness and nausea in the mornings which had kept him from coming to PT appts . Pt continues to be monitored by his cardiologist for A-fib. Pt was asked about whether he was able to sleep in his bed and use his CPAP machine but he has not been able to sleep through the night due to pain and he resorts to his recliner to sleep. PT encouraged pt to move a small table to hold his CPAP machine next to his recliner in order to ensure regular use of his CPAP. Pt voiced understanding. PT reviewed upcoming apts with Dr. Barnet Glasgow, PT (who specializes with Parkinsons patients) and pt plans to keep these appts. PT discussed that  if pt feels a need for more assistance to maintain functional mobility to home after completing OPPT, then pt may benefit from [place] PT. PT plans to f/u with pt and healthcare providers prior to d/c.

## 2015-12-03 ENCOUNTER — Ambulatory Visit: Payer: Medicare Other | Admitting: Physical Therapy

## 2015-12-07 ENCOUNTER — Encounter: Payer: BLUE CROSS/BLUE SHIELD | Admitting: Physical Therapy

## 2015-12-10 ENCOUNTER — Encounter: Payer: BLUE CROSS/BLUE SHIELD | Admitting: Physical Therapy

## 2015-12-29 ENCOUNTER — Encounter: Payer: BLUE CROSS/BLUE SHIELD | Admitting: Physical Therapy

## 2016-05-30 HISTORY — PX: JOINT REPLACEMENT: SHX530

## 2016-06-15 ENCOUNTER — Inpatient Hospital Stay: Admission: RE | Admit: 2016-06-15 | Payer: BLUE CROSS/BLUE SHIELD | Source: Ambulatory Visit

## 2016-06-22 ENCOUNTER — Encounter
Admission: RE | Admit: 2016-06-22 | Discharge: 2016-06-22 | Disposition: A | Discharge status: Home / Self Care | Payer: Medicare Other | Source: Ambulatory Visit | Attending: Orthopedic Surgery | Admitting: Orthopedic Surgery

## 2016-06-22 DIAGNOSIS — Z01818 Encounter for other preprocedural examination: Secondary | ICD-10-CM | POA: Insufficient documentation

## 2016-06-22 HISTORY — DX: Parkinson's disease: G20

## 2016-06-22 HISTORY — DX: Hypothyroidism, unspecified: E03.9

## 2016-06-22 HISTORY — DX: Parkinson's disease without dyskinesia, without mention of fluctuations: G20.A1

## 2016-06-22 HISTORY — DX: Malignant (primary) neoplasm, unspecified: C80.1

## 2016-06-22 HISTORY — DX: Other specified postprocedural states: Z98.890

## 2016-06-22 HISTORY — DX: Sleep apnea, unspecified: G47.30

## 2016-06-22 HISTORY — DX: Nausea with vomiting, unspecified: R11.2

## 2016-06-22 HISTORY — DX: Malignant melanoma of skin, unspecified: C43.9

## 2016-06-22 HISTORY — DX: Essential (primary) hypertension: I10

## 2016-06-22 HISTORY — DX: Supraventricular tachycardia, unspecified: I47.10

## 2016-06-22 HISTORY — DX: Supraventricular tachycardia: I47.1

## 2016-06-22 HISTORY — DX: Transient cerebral ischemic attack, unspecified: G45.9

## 2016-06-22 LAB — COMPREHENSIVE METABOLIC PANEL
ALK PHOS: 92 U/L (ref 38–126)
ALT: 12 U/L — AB (ref 17–63)
AST: 20 U/L (ref 15–41)
Albumin: 4.3 g/dL (ref 3.5–5.0)
Anion gap: 8 (ref 5–15)
BILIRUBIN TOTAL: 0.7 mg/dL (ref 0.3–1.2)
BUN: 28 mg/dL — AB (ref 6–20)
CALCIUM: 9.4 mg/dL (ref 8.9–10.3)
CO2: 28 mmol/L (ref 22–32)
CREATININE: 0.84 mg/dL (ref 0.61–1.24)
Chloride: 94 mmol/L — ABNORMAL LOW (ref 101–111)
Glucose, Bld: 99 mg/dL (ref 65–99)
Potassium: 3.7 mmol/L (ref 3.5–5.1)
Sodium: 130 mmol/L — ABNORMAL LOW (ref 135–145)
TOTAL PROTEIN: 7.9 g/dL (ref 6.5–8.1)

## 2016-06-22 LAB — CBC
HEMATOCRIT: 40.2 % (ref 40.0–52.0)
HEMOGLOBIN: 13.5 g/dL (ref 13.0–18.0)
MCH: 29.8 pg (ref 26.0–34.0)
MCHC: 33.6 g/dL (ref 32.0–36.0)
MCV: 88.6 fL (ref 80.0–100.0)
Platelets: 310 10*3/uL (ref 150–440)
RBC: 4.54 MIL/uL (ref 4.40–5.90)
RDW: 13.8 % (ref 11.5–14.5)
WBC: 8.1 10*3/uL (ref 3.8–10.6)

## 2016-06-22 LAB — SURGICAL PCR SCREEN
MRSA, PCR: NEGATIVE
Staphylococcus aureus: NEGATIVE

## 2016-06-22 LAB — SEDIMENTATION RATE: Sed Rate: 27 mm/hr — ABNORMAL HIGH (ref 0–20)

## 2016-06-22 LAB — APTT: aPTT: 30 seconds (ref 24–36)

## 2016-06-22 LAB — TYPE AND SCREEN
ABO/RH(D): A POS
Antibody Screen: NEGATIVE

## 2016-06-22 LAB — PROTIME-INR
INR: 1.1
PROTHROMBIN TIME: 14.2 s (ref 11.4–15.2)

## 2016-06-22 LAB — C-REACTIVE PROTEIN: CRP: 0.9 mg/dL (ref <1.0)

## 2016-06-22 NOTE — Patient Instructions (Signed)
Your procedure is scheduled on: Monday 06/27/16 Report to [place]. 2ND FLOOR MEDICAL MALL ENTRANCE. To find out your arrival time please call [phone] between 1PM - 3PM on Friday 06/24/16.  Remember: Instructions that are not followed completely may result in serious medical risk, up to and including death, or upon the discretion of your surgeon and anesthesiologist your surgery may need to be rescheduled.    __X__ 1. Do not eat food or drink liquids after midnight. No gum chewing or hard candies.     __X__ 2. No Alcohol for 24 hours before or after surgery.   ____ 3. Bring all medications with you on the day of surgery if instructed.    __X__ 4. Notify your doctor if there is any change in your medical condition     (cold, fever, infections).             ___X__5. No smoking within 24 hours of your surgery.     Do not wear jewelry, make-up, hairpins, clips or nail polish.  Do not wear lotions, powders, or perfumes.   Do not shave 48 hours prior to surgery. Men may shave face and neck.  Do not bring valuables to the hospital.    [facility] is not responsible for any belongings or valuables.               Contacts, dentures or bridgework may not be worn into surgery.  Leave your suitcase in the car. After surgery it may be brought to your room.  For patients admitted to the hospital, discharge time is determined by your                treatment team.   Patients discharged the day of surgery will not be allowed to drive home.   Please read over the following fact sheets that you were given:   Pain Booklet and MRSA Information   __X__ Take these medicines the morning of surgery with A SIP OF WATER:    1. CARBIDOPA-LEVODOPA  2. DILTIAZEM  3. LEVOTHYROXINE  4. RAMIPRIL  5.   6.  ____ Fleet Enema (as directed)   __X__ Use CHG Soap as directed  ____ Use inhalers on the day of surgery  ____ Stop metformin 2 days prior to surgery    ____ Take 1/2 of usual insulin dose  the night before surgery and none on the morning of surgery.   __X__ Stop Coumadin/Plavix/aspirin on ALREADY STOPPED  __X__ Stop Anti-inflammatories such as Advil, Aleve, Ibuprofen, Motrin, Naproxen, Naprosyn, Goodies,powder, or aspirin products.  OK to take Tylenol.   __X__ Stop supplements until after surgery.    __X__ Bring C-Pap to the hospital.

## 2016-06-23 LAB — URINE CULTURE
Culture: NO GROWTH
SPECIAL REQUESTS: NORMAL

## 2016-06-26 MED ORDER — TRANEXAMIC ACID 1000 MG/10ML IV SOLN
1000.0000 mg | INTRAVENOUS | Status: DC
  Filled 2016-06-26: qty 10

## 2016-06-26 MED ORDER — CLINDAMYCIN PHOSPHATE 900 MG/50ML IV SOLN: 900.0000 mg | INTRAVENOUS | Status: DC

## 2016-06-27 ENCOUNTER — Inpatient Hospital Stay: Payer: Medicare Other

## 2016-06-27 ENCOUNTER — Encounter: Payer: Self-pay | Admitting: Orthopedic Surgery

## 2016-06-27 ENCOUNTER — Inpatient Hospital Stay
Admission: RE | Admit: 2016-06-27 | Discharge: 2016-06-30 | DRG: 470 | Disposition: A | Discharge status: Home / Self Care | Payer: Medicare Other | Source: Ambulatory Visit | Attending: Orthopedic Surgery | Admitting: Orthopedic Surgery

## 2016-06-27 ENCOUNTER — Encounter
Admission: RE | Disposition: A | Discharge status: Home / Self Care | Payer: Self-pay | Source: Ambulatory Visit | Attending: Orthopedic Surgery

## 2016-06-27 ENCOUNTER — Inpatient Hospital Stay: Payer: Medicare Other | Admitting: Anesthesiology

## 2016-06-27 DIAGNOSIS — Z79899 Other long term (current) drug therapy: Secondary | ICD-10-CM

## 2016-06-27 DIAGNOSIS — Z833 Family history of diabetes mellitus: Secondary | ICD-10-CM

## 2016-06-27 DIAGNOSIS — Z8249 Family history of ischemic heart disease and other diseases of the circulatory system: Secondary | ICD-10-CM | POA: Diagnosis not present

## 2016-06-27 DIAGNOSIS — G4730 Sleep apnea, unspecified: Secondary | ICD-10-CM | POA: Diagnosis present

## 2016-06-27 DIAGNOSIS — Z8673 Personal history of transient ischemic attack (TIA), and cerebral infarction without residual deficits: Secondary | ICD-10-CM

## 2016-06-27 DIAGNOSIS — K219 Gastro-esophageal reflux disease without esophagitis: Secondary | ICD-10-CM | POA: Diagnosis present

## 2016-06-27 DIAGNOSIS — Z923 Personal history of irradiation: Secondary | ICD-10-CM | POA: Diagnosis not present

## 2016-06-27 DIAGNOSIS — E559 Vitamin D deficiency, unspecified: Secondary | ICD-10-CM | POA: Diagnosis present

## 2016-06-27 DIAGNOSIS — I739 Peripheral vascular disease, unspecified: Secondary | ICD-10-CM | POA: Diagnosis present

## 2016-06-27 DIAGNOSIS — Z85820 Personal history of malignant melanoma of skin: Secondary | ICD-10-CM | POA: Diagnosis not present

## 2016-06-27 DIAGNOSIS — I471 Supraventricular tachycardia: Secondary | ICD-10-CM | POA: Diagnosis present

## 2016-06-27 DIAGNOSIS — G43909 Migraine, unspecified, not intractable, without status migrainosus: Secondary | ICD-10-CM | POA: Diagnosis present

## 2016-06-27 DIAGNOSIS — Z7982 Long term (current) use of aspirin: Secondary | ICD-10-CM

## 2016-06-27 DIAGNOSIS — M1611 Unilateral primary osteoarthritis, right hip: Principal | ICD-10-CM | POA: Diagnosis present

## 2016-06-27 DIAGNOSIS — Z96649 Presence of unspecified artificial hip joint: Secondary | ICD-10-CM

## 2016-06-27 DIAGNOSIS — Z9842 Cataract extraction status, left eye: Secondary | ICD-10-CM

## 2016-06-27 DIAGNOSIS — Z9841 Cataract extraction status, right eye: Secondary | ICD-10-CM

## 2016-06-27 DIAGNOSIS — Z881 Allergy status to other antibiotic agents status: Secondary | ICD-10-CM | POA: Diagnosis not present

## 2016-06-27 DIAGNOSIS — I10 Essential (primary) hypertension: Secondary | ICD-10-CM | POA: Diagnosis present

## 2016-06-27 DIAGNOSIS — G20 Parkinson's disease: Secondary | ICD-10-CM | POA: Diagnosis present

## 2016-06-27 DIAGNOSIS — G609 Hereditary and idiopathic neuropathy, unspecified: Secondary | ICD-10-CM | POA: Diagnosis present

## 2016-06-27 DIAGNOSIS — E039 Hypothyroidism, unspecified: Secondary | ICD-10-CM | POA: Diagnosis present

## 2016-06-27 DIAGNOSIS — M6281 Muscle weakness (generalized): Secondary | ICD-10-CM

## 2016-06-27 DIAGNOSIS — Z8546 Personal history of malignant neoplasm of prostate: Secondary | ICD-10-CM | POA: Diagnosis not present

## 2016-06-27 DIAGNOSIS — E785 Hyperlipidemia, unspecified: Secondary | ICD-10-CM | POA: Diagnosis present

## 2016-06-27 DIAGNOSIS — Z87891 Personal history of nicotine dependence: Secondary | ICD-10-CM

## 2016-06-27 DIAGNOSIS — R262 Difficulty in walking, not elsewhere classified: Secondary | ICD-10-CM

## 2016-06-27 HISTORY — PX: TOTAL HIP ARTHROPLASTY: SHX124

## 2016-06-27 LAB — ABO/RH: ABO/RH(D): A POS

## 2016-06-27 SURGERY — ARTHROPLASTY, HIP, TOTAL,POSTERIOR APPROACH
Anesthesia: Spinal | Site: Hip | Laterality: Right | Wound class: Clean

## 2016-06-27 MED ORDER — NEOMYCIN-POLYMYXIN B GU 40-200000 IR SOLN
Status: DC | PRN
  Administered 2016-06-27: 14 mL

## 2016-06-27 MED ORDER — TETRACAINE HCL 1 % IJ SOLN
INTRAMUSCULAR | Status: DC | PRN
  Administered 2016-06-27: 10 mg via INTRASPINAL

## 2016-06-27 MED ORDER — PROPOFOL 500 MG/50ML IV EMUL
INTRAVENOUS | Status: AC
  Filled 2016-06-27: qty 50

## 2016-06-27 MED ORDER — MENTHOL 3 MG MT LOZG
1.0000 | LOZENGE | OROMUCOSAL | Status: DC | PRN
Start: 2016-06-27 — End: 2016-06-30
  Filled 2016-06-27: qty 9

## 2016-06-27 MED ORDER — ACETAMINOPHEN 10 MG/ML IV SOLN
INTRAVENOUS | Status: AC
  Filled 2016-06-27: qty 100

## 2016-06-27 MED ORDER — HYPROMELLOSE (GONIOSCOPIC) 2.5 % OP SOLN
1.0000 [drp] | Freq: Three times a day (TID) | OPHTHALMIC | Status: DC | PRN
  Filled 2016-06-27: qty 15

## 2016-06-27 MED ORDER — ACETAMINOPHEN 325 MG PO TABS: 650.0000 mg | ORAL_TABLET | Freq: Four times a day (QID) | ORAL | Status: DC | PRN

## 2016-06-27 MED ORDER — EPHEDRINE 5 MG/ML INJ
INTRAVENOUS | Status: AC
  Filled 2016-06-27: qty 10

## 2016-06-27 MED ORDER — FENTANYL CITRATE (PF) 100 MCG/2ML IJ SOLN
25.0000 ug | INTRAMUSCULAR | Status: DC | PRN
  Administered 2016-06-27: 50 ug via INTRAVENOUS
  Administered 2016-06-27 (×2): 25 ug via INTRAVENOUS

## 2016-06-27 MED ORDER — ONDANSETRON HCL 4 MG/2ML IJ SOLN: 4.0000 mg | Freq: Four times a day (QID) | INTRAMUSCULAR | Status: DC | PRN

## 2016-06-27 MED ORDER — TRANEXAMIC ACID 1000 MG/10ML IV SOLN
INTRAVENOUS | Status: DC | PRN
  Administered 2016-06-27: 1000 mg via INTRAVENOUS

## 2016-06-27 MED ORDER — CLINDAMYCIN PHOSPHATE 600 MG/50ML IV SOLN
600.0000 mg | Freq: Four times a day (QID) | INTRAVENOUS | Status: AC
  Administered 2016-06-27 – 2016-06-28 (×4): 600 mg via INTRAVENOUS
  Filled 2016-06-27 (×4): qty 50

## 2016-06-27 MED ORDER — PROPOFOL 500 MG/50ML IV EMUL
INTRAVENOUS | Status: DC | PRN
  Administered 2016-06-27: 75 ug/kg/min via INTRAVENOUS
  Administered 2016-06-27: 50 ug/kg/min via INTRAVENOUS
  Administered 2016-06-27: 65 ug/kg/min via INTRAVENOUS

## 2016-06-27 MED ORDER — BISACODYL 10 MG RE SUPP: 10.0000 mg | Freq: Every day | RECTAL | Status: DC | PRN

## 2016-06-27 MED ORDER — MAGNESIUM HYDROXIDE 400 MG/5ML PO SUSP
30.0000 mL | Freq: Every day | ORAL | Status: DC | PRN
  Administered 2016-06-28 – 2016-06-29 (×2): 30 mL via ORAL
  Filled 2016-06-27 (×2): qty 30

## 2016-06-27 MED ORDER — RISAQUAD PO CAPS
1.0000 | ORAL_CAPSULE | Freq: Every day | ORAL | Status: DC
  Administered 2016-06-29: 1 via ORAL
  Filled 2016-06-27: qty 1

## 2016-06-27 MED ORDER — CLINDAMYCIN PHOSPHATE 900 MG/50ML IV SOLN
INTRAVENOUS | Status: AC
  Filled 2016-06-27: qty 50

## 2016-06-27 MED ORDER — MAGNESIUM OXIDE 400 (241.3 MG) MG PO TABS
400.0000 mg | ORAL_TABLET | Freq: Every day | ORAL | Status: DC
  Administered 2016-06-28 – 2016-06-30 (×3): 400 mg via ORAL
  Filled 2016-06-27 (×4): qty 1

## 2016-06-27 MED ORDER — PANTOPRAZOLE SODIUM 40 MG PO TBEC
40.0000 mg | DELAYED_RELEASE_TABLET | Freq: Two times a day (BID) | ORAL | Status: DC
  Administered 2016-06-27 – 2016-06-30 (×6): 40 mg via ORAL
  Filled 2016-06-27 (×6): qty 1

## 2016-06-27 MED ORDER — VITAMIN D 1000 UNITS PO TABS
2000.0000 [IU] | ORAL_TABLET | Freq: Two times a day (BID) | ORAL | Status: DC
  Administered 2016-06-27 – 2016-06-30 (×6): 2000 [IU] via ORAL
  Filled 2016-06-27 (×6): qty 2

## 2016-06-27 MED ORDER — RAMIPRIL 2.5 MG PO CAPS
2.5000 mg | ORAL_CAPSULE | Freq: Every day | ORAL | Status: DC
  Administered 2016-06-28 – 2016-06-30 (×3): 2.5 mg via ORAL
  Filled 2016-06-27 (×3): qty 1

## 2016-06-27 MED ORDER — ENOXAPARIN SODIUM 30 MG/0.3ML ~~LOC~~ SOLN
30.0000 mg | Freq: Two times a day (BID) | SUBCUTANEOUS | Status: DC
  Administered 2016-06-28 – 2016-06-30 (×5): 30 mg via SUBCUTANEOUS
  Filled 2016-06-27 (×5): qty 0.3

## 2016-06-27 MED ORDER — LEVOTHYROXINE SODIUM 125 MCG PO TABS
125.0000 ug | ORAL_TABLET | Freq: Every day | ORAL | Status: DC
  Administered 2016-06-28 – 2016-06-30 (×3): 125 ug via ORAL
  Filled 2016-06-27 (×3): qty 1

## 2016-06-27 MED ORDER — ACETAMINOPHEN 650 MG RE SUPP: 650.0000 mg | Freq: Four times a day (QID) | RECTAL | Status: DC | PRN

## 2016-06-27 MED ORDER — BUPIVACAINE HCL (PF) 0.5 % IJ SOLN
INTRAMUSCULAR | Status: DC | PRN
  Administered 2016-06-27: 2 mL

## 2016-06-27 MED ORDER — TRAMADOL HCL 50 MG PO TABS
50.0000 mg | ORAL_TABLET | ORAL | Status: DC | PRN
  Administered 2016-06-27: 100 mg via ORAL
  Administered 2016-06-28 (×2): 50 mg via ORAL
  Administered 2016-06-28: 100 mg via ORAL
  Administered 2016-06-29 – 2016-06-30 (×3): 50 mg via ORAL
  Filled 2016-06-27 (×2): qty 1
  Filled 2016-06-27 (×2): qty 2
  Filled 2016-06-27 (×3): qty 1

## 2016-06-27 MED ORDER — SENNOSIDES-DOCUSATE SODIUM 8.6-50 MG PO TABS
1.0000 | ORAL_TABLET | Freq: Two times a day (BID) | ORAL | Status: DC
  Administered 2016-06-27 – 2016-06-30 (×6): 1 via ORAL
  Filled 2016-06-27 (×6): qty 1

## 2016-06-27 MED ORDER — ONDANSETRON HCL 4 MG/2ML IJ SOLN
INTRAMUSCULAR | Status: AC
  Filled 2016-06-27: qty 2

## 2016-06-27 MED ORDER — FENTANYL CITRATE (PF) 100 MCG/2ML IJ SOLN
INTRAMUSCULAR | Status: AC
  Filled 2016-06-27: qty 2

## 2016-06-27 MED ORDER — DILTIAZEM HCL ER COATED BEADS 120 MG PO CP24
120.0000 mg | ORAL_CAPSULE | Freq: Every day | ORAL | Status: DC
  Administered 2016-06-28 – 2016-06-30 (×3): 120 mg via ORAL
  Filled 2016-06-27 (×3): qty 1

## 2016-06-27 MED ORDER — ACETAMINOPHEN 10 MG/ML IV SOLN
1000.0000 mg | Freq: Four times a day (QID) | INTRAVENOUS | Status: AC
  Administered 2016-06-28 (×4): 1000 mg via INTRAVENOUS
  Filled 2016-06-27 (×4): qty 100

## 2016-06-27 MED ORDER — POLYETHYLENE GLYCOL 3350 17 G PO PACK
17.0000 g | PACK | Freq: Every day | ORAL | Status: DC
  Administered 2016-06-29: 17 g via ORAL
  Filled 2016-06-27: qty 1

## 2016-06-27 MED ORDER — METOCLOPRAMIDE HCL 10 MG PO TABS
10.0000 mg | ORAL_TABLET | Freq: Three times a day (TID) | ORAL | Status: AC
  Administered 2016-06-28 – 2016-06-29 (×3): 10 mg via ORAL
  Filled 2016-06-27 (×4): qty 1

## 2016-06-27 MED ORDER — MORPHINE SULFATE (PF) 2 MG/ML IV SOLN: 2.0000 mg | INTRAVENOUS | Status: DC | PRN

## 2016-06-27 MED ORDER — LACTATED RINGERS IV SOLN
INTRAVENOUS | Status: DC
  Administered 2016-06-27: 18:00:00 via INTRAVENOUS
  Administered 2016-06-27: 75 mL/h via INTRAVENOUS

## 2016-06-27 MED ORDER — OCUVITE-LUTEIN PO CAPS
1.0000 | ORAL_CAPSULE | Freq: Two times a day (BID) | ORAL | Status: DC
  Administered 2016-06-27 – 2016-06-30 (×6): 1 via ORAL
  Filled 2016-06-27 (×6): qty 1

## 2016-06-27 MED ORDER — FLEET ENEMA 7-19 GM/118ML RE ENEM: 1.0000 | ENEMA | Freq: Once | RECTAL | Status: DC | PRN

## 2016-06-27 MED ORDER — BUPIVACAINE HCL (PF) 0.5 % IJ SOLN
INTRAMUSCULAR | Status: AC
  Filled 2016-06-27: qty 10

## 2016-06-27 MED ORDER — PHENYLEPHRINE HCL 10 MG/ML IJ SOLN
INTRAMUSCULAR | Status: DC | PRN
  Administered 2016-06-27 (×3): 100 ug via INTRAVENOUS

## 2016-06-27 MED ORDER — TETRACAINE HCL 1 % IJ SOLN
INTRAMUSCULAR | Status: AC
  Filled 2016-06-27: qty 2

## 2016-06-27 MED ORDER — PHENOL 1.4 % MT LIQD
1.0000 | OROMUCOSAL | Status: DC | PRN
  Filled 2016-06-27: qty 177

## 2016-06-27 MED ORDER — FERROUS SULFATE 325 (65 FE) MG PO TABS
325.0000 mg | ORAL_TABLET | Freq: Two times a day (BID) | ORAL | Status: DC
  Administered 2016-06-28 – 2016-06-30 (×4): 325 mg via ORAL
  Filled 2016-06-27 (×4): qty 1

## 2016-06-27 MED ORDER — DEXAMETHASONE SODIUM PHOSPHATE 10 MG/ML IJ SOLN
INTRAMUSCULAR | Status: AC
  Filled 2016-06-27: qty 1

## 2016-06-27 MED ORDER — FAMOTIDINE 20 MG PO TABS
ORAL_TABLET | ORAL | Status: AC
  Administered 2016-06-27: 20 mg via ORAL
  Filled 2016-06-27: qty 1

## 2016-06-27 MED ORDER — SODIUM CHLORIDE 0.9 % IV SOLN
INTRAVENOUS | Status: DC
  Administered 2016-06-28 (×2): via INTRAVENOUS

## 2016-06-27 MED ORDER — FAMOTIDINE 20 MG PO TABS
20.0000 mg | ORAL_TABLET | Freq: Once | ORAL | Status: AC
  Administered 2016-06-27: 20 mg via ORAL

## 2016-06-27 MED ORDER — CLINDAMYCIN PHOSPHATE 900 MG/50ML IV SOLN
INTRAVENOUS | Status: DC | PRN
Start: 2016-06-27 — End: 2016-06-27
  Administered 2016-06-27: 900 mg via INTRAVENOUS

## 2016-06-27 MED ORDER — CHLORHEXIDINE GLUCONATE 4 % EX LIQD
60.0000 mL | Freq: Once | CUTANEOUS | Status: DC
Start: 2016-06-27 — End: 2016-06-27

## 2016-06-27 MED ORDER — ONDANSETRON HCL 4 MG/2ML IJ SOLN
INTRAMUSCULAR | Status: DC | PRN
  Administered 2016-06-27: 4 mg via INTRAVENOUS

## 2016-06-27 MED ORDER — ALUM & MAG HYDROXIDE-SIMETH 200-200-20 MG/5ML PO SUSP: 30.0000 mL | ORAL | Status: DC | PRN

## 2016-06-27 MED ORDER — SALINE SPRAY 0.65 % NA SOLN
2.0000 | NASAL | Status: DC | PRN
  Filled 2016-06-27: qty 44

## 2016-06-27 MED ORDER — DEXAMETHASONE SODIUM PHOSPHATE 4 MG/ML IJ SOLN
INTRAMUSCULAR | Status: DC | PRN
  Administered 2016-06-27: 10 mg via INTRAVENOUS

## 2016-06-27 MED ORDER — CARBIDOPA-LEVODOPA 25-100 MG PO TABS
1.0000 | ORAL_TABLET | Freq: Three times a day (TID) | ORAL | Status: DC
  Administered 2016-06-28 – 2016-06-30 (×7): 1 via ORAL
  Filled 2016-06-27 (×7): qty 1

## 2016-06-27 MED ORDER — TRANEXAMIC ACID 1000 MG/10ML IV SOLN
1000.0000 mg | Freq: Once | INTRAVENOUS | Status: AC
  Administered 2016-06-27: 1000 mg via INTRAVENOUS
  Filled 2016-06-27: qty 10

## 2016-06-27 MED ORDER — CELECOXIB 200 MG PO CAPS
200.0000 mg | ORAL_CAPSULE | Freq: Two times a day (BID) | ORAL | Status: DC
  Administered 2016-06-27 – 2016-06-30 (×6): 200 mg via ORAL
  Filled 2016-06-27 (×6): qty 1

## 2016-06-27 MED ORDER — ACETAMINOPHEN 10 MG/ML IV SOLN
INTRAVENOUS | Status: DC | PRN
  Administered 2016-06-27: 1000 mg via INTRAVENOUS

## 2016-06-27 MED ORDER — NEOMYCIN-POLYMYXIN B GU 40-200000 IR SOLN
Status: AC
  Filled 2016-06-27: qty 20

## 2016-06-27 MED ORDER — SODIUM CHLORIDE 0.9 % IV SOLN
INTRAVENOUS | Status: DC | PRN
  Administered 2016-06-27: 25 ug/min via INTRAVENOUS

## 2016-06-27 MED ORDER — ONDANSETRON HCL 4 MG/2ML IJ SOLN
4.0000 mg | Freq: Once | INTRAMUSCULAR | Status: AC | PRN
  Administered 2016-06-27: 4 mg via INTRAVENOUS

## 2016-06-27 MED ORDER — ONDANSETRON HCL 4 MG PO TABS: 4.0000 mg | ORAL_TABLET | Freq: Four times a day (QID) | ORAL | Status: DC | PRN

## 2016-06-27 MED ORDER — DIPHENHYDRAMINE HCL 12.5 MG/5ML PO ELIX: 12.5000 mg | ORAL_SOLUTION | ORAL | Status: DC | PRN

## 2016-06-27 MED ORDER — EPHEDRINE SULFATE 50 MG/ML IJ SOLN
INTRAMUSCULAR | Status: DC | PRN
  Administered 2016-06-27: 10 mg via INTRAVENOUS

## 2016-06-27 MED ORDER — FENTANYL CITRATE (PF) 100 MCG/2ML IJ SOLN
INTRAMUSCULAR | Status: AC
  Administered 2016-06-27: 50 ug via INTRAVENOUS
  Filled 2016-06-27: qty 2

## 2016-06-27 MED ORDER — OXYCODONE HCL 5 MG PO TABS
5.0000 mg | ORAL_TABLET | ORAL | Status: DC | PRN
  Filled 2016-06-27: qty 2

## 2016-06-27 SURGICAL SUPPLY — 55 items
BASIN GRAD PLASTIC 32OZ STRL (MISCELLANEOUS) ×2 IMPLANT
BLADE CLIPPER SURG (BLADE) ×2 IMPLANT
BLADE DRUM FLTD (BLADE) ×2 IMPLANT
BLADE SAW 1 (BLADE) ×2 IMPLANT
CANISTER SUCT 1200ML W/VALVE (MISCELLANEOUS) ×2 IMPLANT
CANISTER SUCT 3000ML (MISCELLANEOUS) ×4 IMPLANT
CAPT HIP TOTAL 2 ×2 IMPLANT
CARTRIDGE OIL MAESTRO DRILL (MISCELLANEOUS) ×1 IMPLANT
CATH FOL LEG HOLDER (MISCELLANEOUS) ×2 IMPLANT
CATH TRAY METER 16FR LF (MISCELLANEOUS) ×2 IMPLANT
DIFFUSER MAESTRO (MISCELLANEOUS) ×2 IMPLANT
DRAPE INCISE IOBAN 66X60 STRL (DRAPES) ×2 IMPLANT
DRAPE SHEET LG 3/4 BI-LAMINATE (DRAPES) ×2 IMPLANT
DRSG DERMACEA 8X12 NADH (GAUZE/BANDAGES/DRESSINGS) ×2 IMPLANT
DRSG OPSITE POSTOP 3X4 (GAUZE/BANDAGES/DRESSINGS) ×2 IMPLANT
DRSG OPSITE POSTOP 4X12 (GAUZE/BANDAGES/DRESSINGS) IMPLANT
DRSG OPSITE POSTOP 4X14 (GAUZE/BANDAGES/DRESSINGS) ×2 IMPLANT
DRSG TEGADERM 4X4.75 (GAUZE/BANDAGES/DRESSINGS) ×4 IMPLANT
DURAPREP 26ML APPLICATOR (WOUND CARE) ×2 IMPLANT
ELECT BLADE 6.5 EXT (BLADE) ×2 IMPLANT
ELECT CAUTERY BLADE 6.4 (BLADE) ×2 IMPLANT
GLOVE BIO SURGEON STRL SZ7.5 (GLOVE) ×2 IMPLANT
GLOVE BIOGEL M STRL SZ7.5 (GLOVE) ×4 IMPLANT
GLOVE BIOGEL PI IND STRL 7.0 (GLOVE) ×3 IMPLANT
GLOVE BIOGEL PI INDICATOR 7.0 (GLOVE) ×3
GLOVE INDICATOR 8.0 STRL GRN (GLOVE) ×4 IMPLANT
GLOVE SURG 9.0 ORTHO LTXF (GLOVE) ×2 IMPLANT
GLOVE SURG ORTHO 9.0 STRL STRW (GLOVE) ×4 IMPLANT
GOWN STRL REUS W/ TWL LRG LVL3 (GOWN DISPOSABLE) ×2 IMPLANT
GOWN STRL REUS W/TWL 2XL LVL3 (GOWN DISPOSABLE) ×2 IMPLANT
GOWN STRL REUS W/TWL LRG LVL3 (GOWN DISPOSABLE) ×4
HANDPIECE INTERPULSE COAX TIP (DISPOSABLE) ×1
HEMOVAC 400CC 10FR (MISCELLANEOUS) ×2 IMPLANT
HOOD PEEL AWAY FLYTE STAYCOOL (MISCELLANEOUS) ×4 IMPLANT
KIT RM TURNOVER STRD PROC AR (KITS) ×2 IMPLANT
NDL SAFETY 18GX1.5 (NEEDLE) ×2 IMPLANT
NS IRRIG 500ML POUR BTL (IV SOLUTION) ×2 IMPLANT
OIL CARTRIDGE MAESTRO DRILL (MISCELLANEOUS) ×2
PACK HIP PROSTHESIS (MISCELLANEOUS) ×2 IMPLANT
SET HNDPC FAN SPRY TIP SCT (DISPOSABLE) ×1 IMPLANT
SOL .9 NS 3000ML IRR  AL (IV SOLUTION) ×1
SOL .9 NS 3000ML IRR UROMATIC (IV SOLUTION) ×1 IMPLANT
SOL PREP PVP 2OZ (MISCELLANEOUS) ×2
SOLUTION PREP PVP 2OZ (MISCELLANEOUS) ×1 IMPLANT
SPONGE DRAIN TRACH 4X4 STRL 2S (GAUZE/BANDAGES/DRESSINGS) ×4 IMPLANT
STAPLER SKIN PROX 35W (STAPLE) ×2 IMPLANT
SUT ETHIBOND #5 BRAIDED 30INL (SUTURE) ×2 IMPLANT
SUT VIC AB 0 CT1 36 (SUTURE) ×2 IMPLANT
SUT VIC AB 1 CT1 36 (SUTURE) ×4 IMPLANT
SUT VIC AB 2-0 CT1 27 (SUTURE) ×1
SUT VIC AB 2-0 CT1 TAPERPNT 27 (SUTURE) ×1 IMPLANT
SYR 20CC LL (SYRINGE) ×2 IMPLANT
TAPE ADH 3 LX (MISCELLANEOUS) IMPLANT
TAPE TRANSPORE STRL 2 31045 (GAUZE/BANDAGES/DRESSINGS) ×2 IMPLANT
TOWEL OR 17X26 4PK STRL BLUE (TOWEL DISPOSABLE) ×2 IMPLANT

## 2016-06-27 NOTE — Transfer of Care (Signed)
Immediate Anesthesia Transfer of Care Note  Patient: Duane Vasquez  Procedure(s) Performed: Procedure(s): TOTAL HIP ARTHROPLASTY (Right)  Patient Location: PACU  Anesthesia Type:Spinal  Level of Consciousness: sedated  Airway & Oxygen Therapy: Patient Spontanous Breathing and Patient connected to face mask oxygen  Post-op Assessment: Report given to RN and Post -op Vital signs reviewed and stable  Post vital signs: Reviewed and stable  Last Vitals:  Vitals:   06/27/16 1102  BP: (!) 160/76  Pulse: 74  Resp: 16  Temp: 36.6 C    Last Pain:  Vitals:   06/27/16 1102  TempSrc: Oral  PainSc: 3          Complications: No apparent anesthesia complications

## 2016-06-27 NOTE — Anesthesia Procedure Notes (Signed)
Spinal  Patient location during procedure: OR Start time: 06/27/2016 3:47 PM End time: 06/27/2016 3:54 PM Staffing Anesthesiologist: Randa Lynn AMY Resident/CRNA: Vencent Hauschild Performed: resident/CRNA  Preanesthetic Checklist Completed: patient identified, site marked, surgical consent, pre-op evaluation, timeout performed, IV checked, risks and benefits discussed and monitors and equipment checked Spinal Block Patient position: sitting Prep: Betadine Patient monitoring: heart rate, continuous pulse ox, blood pressure and cardiac monitor Approach: midline Location: L4-5 Injection technique: single-shot Needle Needle type: Introducer and Pencil-Tip  Needle gauge: 24 G Needle length: 9 cm Additional Notes Negative paresthesia. Negative blood return. Positive free-flowing CSF. Expiration date of kit checked and confirmed. Patient tolerated procedure well, without complications.

## 2016-06-27 NOTE — Brief Op Note (Signed)
06/27/2016  7:15 PM  PATIENT:  Duane Vasquez  83 y.o. male  PRE-OPERATIVE DIAGNOSIS:  PRIMARY OSTEOARTHRITIS of the right hip  POST-OPERATIVE DIAGNOSIS:  Same  PROCEDURE:  Procedure(s): TOTAL HIP ARTHROPLASTY (Right)  SURGEON:  Surgeon(s) and Role:    * Dereck Leep, MD - Primary  ASSISTANTS: Vance Peper, PA   ANESTHESIA:   spinal  EBL:  100 mL  Fluids replaced: 1300 mL of crystalloid  BLOOD ADMINISTERED:none  DRAINS: 2 medium Hemovac drains   LOCAL MEDICATIONS USED:  NONE  SPECIMEN:  Source of Specimen:  Right femoral head  DISPOSITION OF SPECIMEN:  PATHOLOGY  COUNTS:  YES  TOURNIQUET:  * No tourniquets in log *  DICTATION: .Dragon Dictation  PLAN OF CARE: Admit to inpatient   PATIENT DISPOSITION:  PACU - hemodynamically stable.   Delay start of Pharmacological VTE agent (>24hrs) due to surgical blood loss or risk of bleeding: yes

## 2016-06-27 NOTE — Anesthesia Preprocedure Evaluation (Signed)
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:   Pulmonary shortness of breath and with exertion, sleep apnea , neg COPD, Recent URI , Resolved, former smoker,    Pulmonary exam normal breath sounds clear to auscultation       Cardiovascular Exercise Tolerance: Good hypertension, (-) angina+ Peripheral Vascular Disease  (-) CAD, (-) Past MI, (-) Cardiac Stents and (-) CABG Normal cardiovascular exam+ dysrhythmias Supra Ventricular Tachycardia (-) Valvular Problems/Murmurs Rhythm:regular Rate:Normal     Neuro/Psych neg Seizures TIA Neuromuscular disease (Parkinson's disease) negative psych ROS   GI/Hepatic Neg liver ROS, GERD  ,  Endo/Other  neg diabetesHypothyroidism   Renal/GU negative Renal ROS  negative genitourinary   Musculoskeletal   Abdominal   Peds  Hematology negative hematology ROS (+)   Anesthesia Other Findings Past Medical History: No date: Cancer ([place])     Comment: prostate No date: GERD (gastroesophageal reflux disease) No date: Hypertension No date: Hypothyroidism No date: Melanoma ([place]) No date: Parkinson disease ([place]) No date: PONV (postoperative nausea and vomiting) No date: Sleep apnea No date: SVT (supraventricular tachycardia) ([facility]) No date: TIA (transient ischemic attack)   Reproductive/Obstetrics negative OB ROS                             Anesthesia Physical Anesthesia Plan  ASA: III  Anesthesia Plan: Spinal   Post-op Pain Management:    Induction:    Airway Management Planned:   Additional Equipment:   Intra-op Plan:   Post-operative Plan:   Informed Consent: I have reviewed the patients History and Physical, chart, labs and discussed the procedure including the risks, benefits and alternatives for the proposed anesthesia with the patient or authorized representative who has indicated his/her understanding and acceptance.   Dental Advisory Given  Plan Discussed with: Anesthesiologist, CRNA and Surgeon  Anesthesia Plan Comments:         Anesthesia Quick Evaluation

## 2016-06-27 NOTE — Anesthesia Post-op Follow-up Note (Cosign Needed)
Anesthesia QCDR form completed.        

## 2016-06-27 NOTE — H&P (Signed)
The patient has been re-examined, and the chart reviewed, and there have been no interval changes to the documented history and physical.    The risks, benefits, and alternatives have been discussed at length. The patient expressed understanding of the risks benefits and agreed with plans for surgical intervention.  James P. Hooten, Jr. M.D.    

## 2016-06-27 NOTE — Op Note (Signed)
OPERATIVE NOTE  DATE OF SURGERY:  06/27/2016  PATIENT NAME:  Duane Vasquez   DOB: March 24, 1926  MRN: [id number]  PRE-OPERATIVE DIAGNOSIS: Degenerative arthrosis of the right hip, primary  POST-OPERATIVE DIAGNOSIS:  Same  PROCEDURE:  Right total hip arthroplasty  SURGEON:  Marciano Sequin. M.D.  ASSISTANT:  Vance Peper, PA (present and scrubbed throughout the case, critical for assistance with exposure, retraction, instrumentation, and closure)  ANESTHESIA: spinal  ESTIMATED BLOOD LOSS: 100 mL  FLUIDS REPLACED: 1300 mL of crystalloid  DRAINS: 2 medium drains to a Hemovac reservoir  IMPLANTS UTILIZED: DePuy 15 mm small stature AML femoral stem, 60 mm OD Pinnacle Gription Sector acetabular component, 6.5 x 30 mm cancellous bone screw, +4 mm 10 Pinnacle Marathon polyethylene insert, and a 36 mm M-SPEC +1.5 mm hip ball  INDICATIONS FOR SURGERY: Duane Vasquez is a 83 y.o. year old male with a long history of progressive hip and groin  pain. X-rays demonstrated severe degenerative changes. The patient had not seen any significant improvement despite conservative nonsurgical intervention. After discussion of the risks and benefits of surgical intervention, the patient expressed understanding of the risks benefits and agree with plans for total hip arthroplasty.   The risks, benefits, and alternatives were discussed at length including but not limited to the risks of infection, bleeding, nerve injury, stiffness, blood clots, the need for revision surgery, limb length inequality, dislocation, cardiopulmonary complications, among others, and they were willing to proceed.  PROCEDURE IN DETAIL: The patient was brought into the operating room and, after adequate spinal anesthesia was achieved, the patient was placed in a left lateral decubitus position. Axillary roll was placed and all bony prominences were well-padded. The patient's right hip was cleaned and prepped with alcohol and DuraPrep and  draped in the usual sterile fashion. A "timeout" was performed as per usual protocol. A lateral curvilinear incision was made gently curving towards the posterior superior iliac spine. The IT band was incised in line with the skin incision and the fibers of the gluteus maximus were split in line. The piriformis tendon was identified, skeletonized, and incised at its insertion to the proximal femur and reflected posteriorly. A T type posterior capsulotomy was performed. Prior to dislocation of the femoral head, a threaded Steinmann pin was inserted through a separate stab incision into the pelvis superior to the acetabulum and bent in the form of a stylus so as to assess limb length and hip offset throughout the procedure. The femoral head was then dislocated posteriorly. Inspection of the femoral head demonstrated severe degenerative changes with full-thickness loss of articular cartilage. The femoral neck cut was performed using an oscillating saw. The anterior capsule was elevated off of the femoral neck using a periosteal elevator. Attention was then directed to the acetabulum. The remnant of the labrum was excised using electrocautery. Inspection of the acetabulum also demonstrated significant degenerative changes. The acetabulum was reamed in sequential fashion up to a 59 mm diameter. Good punctate bleeding bone was encountered. A 60 mm Pinnacle Gription Sector acetabular component was positioned and impacted into place. Good scratch fit was appreciated. It was elected to insert a 6.5 mm x 30 mm cancellous bone screw through the dome for additional fixation. A neutral polyethylene trial was inserted.  Attention was then directed to the proximal femur. A hole for reaming of the proximal femoral canal was created using a high-speed burr. The femoral canal was reamed in sequential fashion up to a 14.5 mm diameter. This  allowed for approximately 6 cm of scratch fit. It was elected to ream up to a 15 mm diameter  to allow for a line to line fit. Serial broaches were inserted up to a 15 mm small stature femoral broach. Calcar region was planed and a trial reduction was performed using a 36 mm hip ball with a +1.5 mm neck length. Reasonably good stability was noted but it was elected to trial a +4 mm 10 liner trial with the high side positioned at the 8:00 position. Good equalization of limb lengths and hip offset was appreciated and excellent stability was noted both anteriorly and posteriorly. Trial components were removed. The acetabular shell was irrigated with copious amounts of normal saline with antibiotic solution and suctioned dry. A +4 mm 10 Pinnacle Marathon polyethylene insert was positioned with the high side located at the 8:00 position and impacted into place. Next, a 15 mm small stature AML femoral stem was positioned and impacted into place. Excellent scratch fit was appreciated. A trial reduction was again performed with a 36 mm hip ball with a +1.5 mm neck length. Again, good equalization of limb lengths was appreciated and excellent stability appreciated both anteriorly and posteriorly. The hip was then dislocated and the trial hip ball was removed. The Morse taper was cleaned and dried. A 36 mm M-SPEC hip ball with a +1.5 mm neck length was placed on the trunnion and impacted into place. The hip was then reduced and placed through range of motion. Excellent stability was appreciated both anteriorly and posteriorly.  The wound was irrigated with copious amounts of normal saline with antibiotic solution and suctioned dry. Good hemostasis was appreciated. The posterior capsulotomy was repaired using #5 Ethibond. Piriformis tendon was reapproximated to the undersurface of the gluteus medius tendon using #5 Ethibond. Two medium drains were placed in the wound bed and brought out through separate stab incisions to be attached to a Hemovac reservoir. The IT band was reapproximated using interrupted sutures of  #1 Vicryl. Subcutaneous tissue was approximated using first #0 Vicryl followed by #2-0 Vicryl. The skin was closed with skin staples.  The patient tolerated the procedure well and was transported to the recovery room in stable condition.   Marciano Sequin., M.D.

## 2016-06-28 ENCOUNTER — Encounter: Payer: Self-pay | Admitting: Orthopedic Surgery

## 2016-06-28 LAB — BASIC METABOLIC PANEL
Anion gap: 4 — ABNORMAL LOW (ref 5–15)
BUN: 26 mg/dL — AB (ref 6–20)
CALCIUM: 8 mg/dL — AB (ref 8.9–10.3)
CHLORIDE: 99 mmol/L — AB (ref 101–111)
CO2: 23 mmol/L (ref 22–32)
Creatinine, Ser: 0.82 mg/dL (ref 0.61–1.24)
GFR calc Af Amer: >60 mL/min (ref >60)
GFR calc non Af Amer: >60 mL/min (ref >60)
GLUCOSE: 180 mg/dL — AB (ref 65–99)
Potassium: 4 mmol/L (ref 3.5–5.1)
Sodium: 126 mmol/L — ABNORMAL LOW (ref 135–145)

## 2016-06-28 LAB — CBC
HEMATOCRIT: 29 % — AB (ref 40.0–52.0)
HEMOGLOBIN: 10.2 g/dL — AB (ref 13.0–18.0)
MCH: 30.8 pg (ref 26.0–34.0)
MCHC: 35.4 g/dL (ref 32.0–36.0)
MCV: 87 fL (ref 80.0–100.0)
Platelets: 225 10*3/uL (ref 150–440)
RBC: 3.33 MIL/uL — ABNORMAL LOW (ref 4.40–5.90)
RDW: 14 % (ref 11.5–14.5)
WBC: 8.9 10*3/uL (ref 3.8–10.6)

## 2016-06-28 MED ORDER — POLYVINYL ALCOHOL 1.4 % OP SOLN
1.0000 [drp] | Freq: Three times a day (TID) | OPHTHALMIC | Status: DC | PRN
  Filled 2016-06-28: qty 15

## 2016-06-28 MED ORDER — ROPINIROLE HCL 0.25 MG PO TABS: 0.5000 mg | ORAL_TABLET | Freq: Every day | ORAL | Status: DC

## 2016-06-28 NOTE — Anesthesia Postprocedure Evaluation (Signed)
Anesthesia Post Note  Patient: Duane Vasquez  Procedure(s) Performed: Procedure(s) (LRB): TOTAL HIP ARTHROPLASTY (Right)  Patient location during evaluation: Nursing Unit Anesthesia Type: Spinal Level of consciousness: awake, awake and alert and oriented Pain management: pain level controlled Vital Signs Assessment: post-procedure vital signs reviewed and stable Respiratory status: spontaneous breathing, nonlabored ventilation and respiratory function stable Cardiovascular status: blood pressure returned to baseline and stable Postop Assessment: no headache and no backache Anesthetic complications: no     Last Vitals:  Vitals:   06/28/16 0201 06/28/16 0330  BP: 116/67 134/74  Pulse: 75 76  Resp: 18 18  Temp:  36.9 C    Last Pain:  Vitals:   06/28/16 0559  TempSrc:   PainSc: 4                  [employer]

## 2016-06-28 NOTE — Discharge Summary (Signed)
Physician Discharge Summary  Patient ID: Duane Vasquez MRN: [id number] DOB/AGE: [birth year] 83 y.o.  Admit date: 06/27/2016 Discharge date: 06/30/2016  Admission Diagnoses:  PRIMARY OSTEOARTHRITIS   Discharge Diagnoses: Patient Active Problem List   Diagnosis Date Noted  . S/P total hip arthroplasty 06/27/2016  . Lumbar radiculitis 07/14/2015  . DDD (degenerative disc disease), lumbar 07/14/2015  . Parkinson's disease ([place]) 05/20/2015  . CSR (central serous retinopathy), left 10/18/2014  . Benign meningioma ([place]) 10/18/2014  . Vitamin D deficiency 10/02/2014  . Carotid artery stenosis 10/02/2014  . Paroxysmal SVT (supraventricular tachycardia) ([place]) 07/30/2014  . Hypothyroidism due to acquired atrophy of thyroid 04/01/2014  . Hypertension 02/12/2014  . Sleep apnea 11/13/2013  . Hyperlipidemia, unspecified 11/13/2013  . Hereditary and idiopathic peripheral neuropathy 09/06/2013  . Inguinal hernia 09/06/2013  . Increased frequency of urination 03/21/2012  . Personal history of prostate cancer 03/21/2012  . Hemorrhoids 03/21/2012    Past Medical History:  Diagnosis Date  . Cancer [facility])    prostate  . GERD (gastroesophageal reflux disease)   . Hypertension   . Hypothyroidism   . Melanoma ([place])   . Parkinson disease ([place])   . PONV (postoperative nausea and vomiting)   . Sleep apnea   . SVT (supraventricular tachycardia) ([place])   . TIA (transient ischemic attack)      Transfusion: No transfusions given doing this admission   Consultants (if any):  case management for home health assistance  Discharged Condition: Improved  Hospital Course: Duane Vasquez is an 83 y.o. male who was admitted 06/27/2016 with a diagnosis of degenerative arthrosis right hip and went to the operating room on 06/27/2016 and underwent the above named procedures.    Surgeries:Procedure(s): TOTAL HIP ARTHROPLASTY on 06/27/2016  PRE-OPERATIVE DIAGNOSIS: Degenerative arthrosis of the right  hip, primary  POST-OPERATIVE DIAGNOSIS:  Same  PROCEDURE:  Right total hip arthroplasty  SURGEON:  Duane Vasquez. M.D.  ASSISTANT:  Duane Peper, PA (present and scrubbed throughout the case, critical for assistance with exposure, retraction, instrumentation, and closure)  ANESTHESIA: spinal  ESTIMATED BLOOD LOSS: 100 mL  FLUIDS REPLACED: 1300 mL of crystalloid  DRAINS: 2 medium drains to a Hemovac reservoir  IMPLANTS UTILIZED: DePuy 15 mm small stature AML femoral stem, 60 mm OD Pinnacle Gription Sector acetabular component, 6.5 x 30 mm cancellous bone screw, +4 mm 10 Pinnacle Marathon polyethylene insert, and a 36 mm M-SPEC +1.5 mm hip ball  INDICATIONS FOR SURGERY: Duane Vasquez is a 83 y.o. year old male with a long history of progressive hip and groin  pain. X-rays demonstrated severe degenerative changes. The patient had not seen any significant improvement despite conservative nonsurgical intervention. After discussion of the risks and benefits of surgical intervention, the patient expressed understanding of the risks benefits and agree with plans for total hip arthroplasty.   The risks, benefits, and alternatives were discussed at length including but not limited to the risks of infection, bleeding, nerve injury, stiffness, blood clots, the need for revision surgery, limb length inequality, dislocation, cardiopulmonary complications, among others, and they were willing to proceed. Patient tolerated the surgery well. No complications .Patient was taken to PACU where she was stabilized and then transferred to the orthopedic floor.  Patient started on Lovenox 30 mg q 12 hrs. Foot pumps applied bilaterally at 80 mm hgb. Heels elevated off bed with rolled towels. No evidence of DVT. Calves non tender. Negative Homan. Physical therapy started on day #1 for gait training and transfer  with OT starting on  day #1 for ADL and assisted devices. Patient has done well with  therapy. Ambulated greater than 200 was able to ascend and descend 4 steps safely and independently feet upon being discharged.  Patient's IV and Foley were discontinued on day #1 with Hemovac being discontinued on day #2 along with dressing change prior to patient being discharged.   He was given perioperative antibiotics:  Anti-infectives    Start     Dose/Rate Route Frequency Ordered Stop   06/27/16 2145  clindamycin (CLEOCIN) IVPB 600 mg     600 mg 100 mL/hr over 30 Minutes Intravenous Every 6 hours 06/27/16 2130 06/28/16 2144   06/27/16 0807  clindamycin (CLEOCIN) 900 MG/50ML IVPB    Comments:  Duane Vasquez: cabinet override      06/27/16 0807 06/27/16 1608   06/27/16 0600  clindamycin (CLEOCIN) IVPB 900 mg  Status:  Discontinued     900 mg 100 mL/hr over 30 Minutes Intravenous On call to O.R. 06/26/16 2222 06/27/16 1056    .  He was fitted with AV 1 compression foot pump devices, instructed on heel pumps, early ambulation, and TED stockings bilaterally for DVT prophylaxis.  He benefited maximally from the hospital stay and there were no complications.    Recent vital signs:  Vitals:   06/28/16 0330 06/28/16 0734  BP: 134/74 128/69  Pulse: 76 73  Resp: 18 18  Temp: 98.4 F (36.9 C) 97.5 F (36.4 C)    Recent laboratory studies:  Lab Results  Component Value Date   HGB 10.2 (L) 06/28/2016   HGB 13.5 06/22/2016   HGB 14.0 12/31/2012   Lab Results  Component Value Date   WBC 8.9 06/28/2016   PLT 225 06/28/2016   Lab Results  Component Value Date   INR 1.10 06/22/2016   Lab Results  Component Value Date   NA 126 (L) 06/28/2016   K 4.0 06/28/2016   CL 99 (L) 06/28/2016   CO2 23 06/28/2016   BUN 26 (H) 06/28/2016   CREATININE 0.82 06/28/2016   GLUCOSE 180 (H) 06/28/2016    Discharge Medications:   Allergies as of 06/30/2016      Reactions   Amoxicillin Diarrhea   Has patient had a PCN reaction causing immediate rash, facial/tongue/throat swelling,  SOB or lightheadedness with hypotension:No Has patient had a PCN reaction causing severe rash involving mucus membranes or skin necrosis:No Has patient had a PCN reaction that required hospitalization No Has patient had a PCN reaction occurring within the last 10 years:Yes If all of the above answers are "NO", then may proceed with Cephalosporin use.   Cefuroxime Axetil Nausea Only      Medication List    STOP taking these medications   GOODSENSE ASPIRIN 325 MG tablet Generic drug:  aspirin     TAKE these medications   acetaminophen 500 MG tablet Commonly known as:  TYLENOL Take 1,000 mg by mouth every 6 (six) hours as needed (for pain.).   carbidopa-levodopa 25-100 MG tablet Commonly known as:  SINEMET IR Take 1 tablet by mouth 3 (three) times daily.   cholecalciferol 1000 units tablet Commonly known as:  VITAMIN D Take 2,000 Units by mouth 2 (two) times daily.   diltiazem 120 MG 24 hr capsule Commonly known as:  CARDIZEM CD Take 120 mg by mouth daily.   enoxaparin 40 MG/0.4ML injection Commonly known as:  LOVENOX Inject 0.4 mLs (40 mg total) into the skin daily.   GAVISCON  EXTRA STRENGTH 508-475 MG/10ML Susp Generic drug:  Alum Hydroxide-Mag Carbonate Take 5 mLs by mouth daily as needed.   hydroxypropyl methylcellulose / hypromellose 2.5 % ophthalmic solution Commonly known as:  ISOPTO TEARS / GONIOVISC Place 1 drop into both eyes 3 (three) times daily as needed for dry eyes.   LEVAQUIN 500 MG tablet Generic drug:  levofloxacin Take 500 mg by mouth daily.   MAGOX 400 400 (241.3 Mg) MG tablet Generic drug:  magnesium oxide Take 400 mg by mouth daily.   MIRALAX powder Generic drug:  polyethylene glycol powder Take 17 g by mouth daily at 2 PM.   MUSCLE RUB 10-15 % Crea Apply 1 application topically 4 (four) times daily as needed for muscle pain.   oxyCODONE 5 MG immediate release tablet Commonly known as:  Oxy IR/ROXICODONE Take 1-2 tablets (5-10 mg total)  by mouth every 4 (four) hours as needed for severe pain.   PHILLIPS COLON HEALTH Caps Take 1 capsule by mouth daily with lunch.   PRESERVISION AREDS 2+MULTI VIT PO Take 1 tablet by mouth 2 (two) times daily.   ramipril 2.5 MG capsule Commonly known as:  ALTACE Take 2.5 mg by mouth daily.   sodium chloride 0.65 % Soln nasal spray Commonly known as:  OCEAN Place 2 sprays into both nostrils as needed for congestion.   SYNTHROID 125 MCG tablet Generic drug:  levothyroxine Take 125 mcg by mouth daily before breakfast.   traMADol 50 MG tablet Commonly known as:  ULTRAM Take 1-2 tablets (50-100 mg total) by mouth every 4 (four) hours as needed for moderate pain.   traMADol-acetaminophen 37.5-325 MG tablet Commonly known as:  ULTRACET Take 1-2 tablets by mouth every 6 (six) hours as needed. For occasional pain.            Durable Medical Equipment        Start     Ordered   06/27/16 2131  DME Walker rolling  Once    Question:  Patient needs a walker to treat with the following condition  Answer:  S/P total hip arthroplasty   06/27/16 2130   06/27/16 2131  DME Bedside commode  Once    Question:  Patient needs a bedside commode to treat with the following condition  Answer:  S/P total hip arthroplasty   06/27/16 2130      Diagnostic Studies: Dg Hip Port Unilat With Pelvis 1v Right  Result Date: 06/27/2016 CLINICAL DATA:  Status post right total hip arthroplasty. Initial encounter. EXAM: DG HIP (WITH OR WITHOUT PELVIS) 1V PORT RIGHT COMPARISON:  CT of the abdomen and pelvis from 09/23/2009 FINDINGS: The patient is status post right total hip arthroplasty, without evidence of loosening or new fracture. Overlying drainage catheters and skin staples are seen, with scattered soft tissue air. A mesh is noted overlying the left hemipelvis, and brachytherapy seeds are seen at the prostate bed. Degenerative change is noted at the left hip, with diffuse joint space narrowing and  sclerosis, and underlying subcortical cystic change. The visualized bowel gas pattern is grossly unremarkable. IMPRESSION: Status post right total hip arthroplasty, without evidence of loosening or new fracture. Electronically Signed   By: Garald Balding M.D.   On: 06/27/2016 19:47    Disposition: Final discharge disposition not confirmed    Follow-up Information    Dereck Leep, MD On 08/09/2016.   Specialty:  Orthopedic Surgery Why:  at 1:45pm Contact information: [address] [place] [address] [phone]  Signed: Rakayla Ricklefs R. 06/28/2016, 7:52 AM

## 2016-06-28 NOTE — Discharge Instructions (Signed)

## 2016-06-28 NOTE — Progress Notes (Signed)
Physical Therapy Treatment Patient Details Name: Duane Vasquez MRN: [id number] DOB: 1926-05-12 Today's Date: 06/28/2016    History of Present Illness 83 y/o male s/p R total hip replacement, posterior approach. PMHx significant for Parkinson's, peripheral neuropathy, DDD, TIA, HTN, GERD, prostate cancer, sleep apnea, SVT.    PT Comments    Pt was able to ambulate farther than expected this AM and appeared to be relatively comfortable and confident t/o the effort.  He was able to get in/out of bed w/o direct assist and also was able to do 10 SLRs w/o assist.  Despite heavy arthritic joints t/o LE and spine he was able to manage most PT activities well.  Pt pleasant and motivated t/o the session.   Follow Up Recommendations  Home health PT;Supervision/Assistance - 24 hour     Equipment Recommendations  3in1 (PT)    Recommendations for Other Services       Precautions / Restrictions Precautions Precautions: Posterior Hip;Fall Precaution Booklet Issued: Yes (comment) Restrictions Weight Bearing Restrictions: Yes RLE Weight Bearing: Weight bearing as tolerated    Mobility  Bed Mobility Overal bed mobility: Modified Independent             General bed mobility comments: Pt actually able to get his LEs to EOB and himself up to sitting w/ realtive ease and with some increased effort but w/o direct assist was able to get his LEs back into bed and to supine w/o assist  Transfers Overall transfer level: Modified independent Equipment used: Rolling walker (2 wheeled)             General transfer comment: Pt needed reminders for hand placement (both rising and sitting) but was able to control transfers relatively well.  Was slow and labored but safe with transfers.   Ambulation/Gait Ambulation/Gait assistance: Min guard Ambulation Distance (Feet): 65 Feet Assistive device: Rolling walker (2 wheeled)       General Gait Details: Pt did well with ambulation and though he  had some fatigue with the effort he was able to maintain consistent speed and showed only minimal hesitation with R WBing.    Stairs            Wheelchair Mobility    Modified Rankin (Stroke Patients Only)       Balance Overall balance assessment: History of Falls                                  Cognition Arousal/Alertness: Awake/alert Behavior During Therapy: WFL for tasks assessed/performed Overall Cognitive Status: Within Functional Limits for tasks assessed                      Exercises Total Joint Exercises Ankle Circles/Pumps: AROM;15 reps Quad Sets: Strengthening;15 reps Gluteal Sets: Strengthening;15 reps Short Arc Quad: Strengthening;10 reps Heel Slides: Strengthening;10 reps Hip ABduction/ADduction: Strengthening;10 reps Straight Leg Raises: AROM;10 reps Other Exercises Other Exercises: pt and dtr educated in A/E, home modifications, energy conservation strategies to support falls prevention and maximizing functional independence; both very thankful for education    General Comments        Pertinent Vitals/Pain Pain Assessment: 0-10 Pain Score: 4  Pain Location: 4/10 R hip at rest, 5/10 chronic lower back pain, 2/10 L knee, 3/10 R knee Pain Intervention(s): Monitored during session;Limited activity within patient's tolerance    Home Living Family/patient expects to be discharged to:: Private residence Living Arrangements: Spouse/significant  other Available Help at Discharge: Family;Available 24 hours/day Type of Home: House Home Access: Stairs to enter   Home Layout: One level Home Equipment: Environmental consultant - 2 wheels;Other (comment);Hand held shower head;Adaptive equipment (Parkinsonian walker) Additional Comments: has power lift recliner    Prior Function Level of Independence: Needs assistance;Independent with assistive device(s)  Gait / Transfers Assistance Needed: indep with assistive device  ADL's / Homemaking Assistance  Needed: more recently required assist for LB bathing and washing his back while standing in shower due to back pain limiting his ability to bend over Comments: Pt is able to do in-the-home activities as needed, apparently low back and general LE arthritic pain in most joints.  Pt reports recent fall 2:2 cane breaking, no serious injuries resulting from fall   PT Goals (current goals can now be found in the care plan section) Acute Rehab PT Goals Patient Stated Goal: go home Progress towards PT goals: Progressing toward goals    Frequency    BID      PT Plan Current plan remains appropriate    Co-evaluation             End of Session Equipment Utilized During Treatment: Gait belt Activity Tolerance: Patient tolerated treatment well Patient left: with bed alarm set;with call bell/phone within reach;with family/visitor present     Time: [id number] PT Time Calculation (min) (ACUTE ONLY): 27 min  Charges:  $Gait Training: 8-22 mins $Therapeutic Exercise: 8-22 mins                    G Codes:      Kreg Shropshire, DPT 06/28/2016, 4:14 PM

## 2016-06-28 NOTE — Care Management Note (Signed)
Case Management Note  Patient Details  Name: PAULANTHONY GLEAVES MRN: [id number] Date of Birth: Feb 28, 1926  Subjective/Objective:  POD # 1 right THA. Met with patient and his spouse at bedside. Discussed home health agencies and patient has no preference. Referral to [facility] for HHPT. PCP is DR. Lisette Grinder. Pharmacy is [facility] [phone]. Called Lovenox 40 mg # 14, no refills. Patient uses a walker at baseline. He will need a 3 in 1.  Ordered from [place] with [facility].                   Action/Plan: Lovenox called in. [facility] at [facility]. 3 in 1 ordered from [facility].   Expected Discharge Date:  06/29/16               Expected Discharge Plan:  [place]  In-House Referral:     Discharge planning Services  CM Consult  Post Acute Care Choice:  Durable Medical Equipment, Home Health Choice offered to:  Patient, Spouse  DME Arranged:  3-N-1 DME Agency:  [place]:  PT [place]:  [facility] (now [facility])  Status of Service:  In process, will continue to follow  If discussed at Long Length of Stay Meetings, dates discussed:    Additional Comments:  Jolly Mango, RN 06/28/2016, 3:49 PM

## 2016-06-28 NOTE — Progress Notes (Signed)
Clinical Social Worker (CSW) received SNF consult. PT is recommending home health. RN case manager aware of above. Please reconsult if future social work needs arise. CSW signing off.   Jaylin Benzel, LCSW [phone] 

## 2016-06-28 NOTE — Evaluation (Signed)
Occupational Therapy Evaluation Patient Details Name: Duane Vasquez MRN: [id number] DOB: [birth year] Today's Date: 06/28/2016    History of Present Illness 83 y/o male s/p R total hip replacement, posterior approach. PMHx significant for Parkinson's, peripheral neuropathy, DDD, TIA, HTN, GERD, prostate cancer, sleep apnea, SVT.   Clinical Impression   Pt seen for OT evaluation this date. Pt received up in recliner at start of session, agreeable to evaluation, daughter present for duration of session. Pt presents with pain in back, knees, and R hip, decreased strength and activity tolerance, decreased knowledge of A/E/DME for ADL tasks, and increased need for assistance for self care tasks. Pt would benefit from skilled OT services to address noted impairments and functional deficits in the areas of bathing, dressing, and toileting. Pt would also benefit from [place] [facility] to assess environmental set up and safety. Recommending various AE/DME (noted below) to support safety and independence with ADL.     Follow Up Recommendations  Home health OT (for home safety assessment for further recommendations to home set up)    Equipment Recommendations  Tub/shower seat;Toilet rise with handles;Toilet riser;Other (comment) (long handled sponge, long handled shoe horn, non-slip tape for shower floor, toilet riser for 2nd bath, toilet riser w/ bilat arm rails for master bath, suction mount for shower head, handheld shower head with on/off switch)    Recommendations for Other Services       Precautions / Restrictions Precautions Precautions: Posterior Hip;Fall Restrictions Weight Bearing Restrictions: Yes RLE Weight Bearing: Weight bearing as tolerated      Mobility Bed Mobility Overal bed mobility: Modified Independent             General bed mobility comments: did not assess; pt up in recliner at start of OT session  Transfers Overall transfer level: Modified independent Equipment used:  Rolling walker (2 wheeled)             General transfer comment: did not assess; pt up in recliner at start of OT session    Balance Overall balance assessment: History of Falls                                          ADL Overall ADL's : Needs assistance/impaired Eating/Feeding: Set up;Sitting   Grooming: Set up;Sitting   Upper Body Bathing: Minimal assistance;Sitting;With adaptive equipment   Lower Body Bathing: Maximal assistance;Sit to/from stand;Adhering to hip precautions   Upper Body Dressing : Sitting;Set up   Lower Body Dressing: Sit to/from stand;Cueing for compensatory techniques;Adhering to hip precautions;With adaptive equipment;Moderate assistance Lower Body Dressing Details (indicate cue type and reason): with minimal verbal cues for hip precautions   Toilet Transfer Details (indicate cue type and reason): did not attempt during evaluation, PT requested pt remain in recliner until after lunch and pt was agreeable to this           General ADL Comments: Pt educated briefly in AE for LB ADL, pt reports using sock aid all the time, pt agreeable that long long handled shoe horn and long handled sponge would be beneficial for ADL tasks     Vision Vision Assessment?: No apparent visual deficits   Perception     Praxis      Pertinent Vitals/Pain Pain Assessment: 0-10 Pain Score: 4  Pain Location: 4/10 R hip at rest, 5/10 chronic lower back pain, 2/10 L knee, 3/10 R knee Pain  Intervention(s): Monitored during session;Limited activity within patient's tolerance     Hand Dominance Right   Extremity/Trunk Assessment Upper Extremity Assessment Upper Extremity Assessment: RUE deficits/detail;LUE deficits/detail;Generalized weakness RUE Deficits / Details: generalized weakness, shoulder flexion within 75% of normal range LUE Deficits / Details: shoulder abduction, horizontal adduction, and flexion limited to approx 75% of normal range making  it difficult to wash his back independently (does not use AE currently, gets assist from spouse)   Lower Extremity Assessment Lower Extremity Assessment: LLE deficits/detail;RLE deficits/detail;Defer to PT evaluation RLE Sensation: history of peripheral neuropathy LLE Sensation: history of peripheral neuropathy   Cervical / Trunk Assessment Cervical / Trunk Assessment: Normal   Communication Communication Communication: No difficulties   Cognition Arousal/Alertness: Awake/alert Behavior During Therapy: WFL for tasks assessed/performed Overall Cognitive Status: Within Functional Limits for tasks assessed                 General Comments: pt was able to recall 3/3 hip precautions with only light cuing   General Comments       Exercises Exercises: General Lower Extremity Other Exercises Other Exercises: pt and dtr educated in A/E, home modifications, energy conservation strategies to support falls prevention and maximizing functional independence; both very thankful for education   Shoulder Instructions      Home Living Family/patient expects to be discharged to:: Private residence Living Arrangements: Spouse/significant other Available Help at Discharge: Family;Available 24 hours/day Type of Home: House Home Access: Stairs to enter [employer] of Steps: 2 w/ hand rails Entrance Stairs-Rails:  (yes) Home Layout: One level     Bathroom Shower/Tub: Walk-in shower;Door   [employer] Toilet: Handicapped height Bathroom Accessibility: Yes How Accessible: Accessible via walker Home Equipment: [place] - 2 wheels;Other (comment);Hand held shower head;Adaptive equipment (Parkinsonian walker) Adaptive Equipment: Reacher;Sock aid Additional Comments: has power lift recliner      Prior Functioning/Environment Level of Independence: Needs assistance;Independent with assistive device(s)  Gait / Transfers Assistance Needed: indep with assistive device  ADL's /  Homemaking Assistance Needed: more recently required assist for LB bathing and washing his back while standing in shower due to back pain limiting his ability to bend over   Comments: Pt is able to do in-the-home activities as needed, apparently low back and general LE arthritic pain in most joints.  Pt reports recent fall 2:2 cane breaking, no serious injuries resulting from fall        OT Problem List: Decreased strength;Pain;Decreased activity tolerance;Decreased knowledge of use of DME or AE   OT Treatment/Interventions: Self-care/ADL training;Therapeutic exercise;Therapeutic activities;Energy conservation;Patient/family education;DME and/or AE instruction    OT Goals(Current goals can be found in the care plan section) Acute Rehab OT Goals Patient Stated Goal: go home OT Goal Formulation: With patient/family Time For Goal Achievement: 07/12/16 Potential to Achieve Goals: Good  OT Frequency: Min 1X/week   Barriers to D/C:            Co-evaluation              End of Session    Activity Tolerance: Patient tolerated treatment well Patient left: in chair;with call bell/phone within reach;with chair alarm set;with family/visitor present   Time: [id number] OT Time Calculation (min): 71 min Charges:  OT General Charges $OT Visit: 1 Procedure OT Evaluation $OT Eval Moderate Complexity: 1 Procedure OT Treatments $Self Care/Home Management : 53-67 mins G-Codes:    Corky Sox, OTR/L 06/28/2016, 1:35 PM

## 2016-06-28 NOTE — NC FL2 (Signed)
[place] LEVEL OF CARE SCREENING TOOL     IDENTIFICATION  Patient Name: Duane Vasquez Birthdate: [birth year] Sex: male Admission Date (Current Location): 06/27/2016  [place] and [place] Number:  Engineering geologist and Address:  [facility], [address] [place], [place] [address]      Provider Number: [id number]  Attending Physician Name and Address:  Dereck Leep, MD  Relative Name and Phone Number:       Current Level of Care: Hospital Recommended Level of Care: [place] Prior Approval Number:    Date Approved/Denied:   PASRR Number:  ([id number])  Discharge Plan: SNF    Current Diagnoses: Patient Active Problem List   Diagnosis Date Noted  . S/P total hip arthroplasty 06/27/2016  . Lumbar radiculitis 07/14/2015  . DDD (degenerative disc disease), lumbar 07/14/2015  . Parkinson's disease ([place]) 05/20/2015  . CSR (central serous retinopathy), left 10/18/2014  . Benign meningioma ([place]) 10/18/2014  . Vitamin D deficiency 10/02/2014  . Carotid artery stenosis 10/02/2014  . Paroxysmal SVT (supraventricular tachycardia) ([place]) 07/30/2014  . Hypothyroidism due to acquired atrophy of thyroid 04/01/2014  . Hypertension 02/12/2014  . Sleep apnea 11/13/2013  . Hyperlipidemia, unspecified 11/13/2013  . Hereditary and idiopathic peripheral neuropathy 09/06/2013  . Inguinal hernia 09/06/2013  . Increased frequency of urination 03/21/2012  . Personal history of prostate cancer 03/21/2012  . Hemorrhoids 03/21/2012    Orientation RESPIRATION BLADDER Height & Weight     Self, Time, Situation, Place  Normal Continent Weight: 160 lb (72.6 kg) Height:  5\' 9"  (175.3 cm)  BEHAVIORAL SYMPTOMS/MOOD NEUROLOGICAL BOWEL NUTRITION STATUS   (None.)  (None. ) Continent Diet (Diet: Regular)  AMBULATORY STATUS COMMUNICATION OF NEEDS Skin   Extensive Assist Verbally Surgical wounds (Incision: Left Hip )                     Personal Care Assistance Level of Assistance  Bathing, Feeding, Dressing Bathing Assistance: Limited assistance Feeding assistance: Independent Dressing Assistance: Limited assistance     Functional Limitations Info  Sight, Speech, Hearing Sight Info: Adequate Hearing Info: Impaired Speech Info: Adequate    SPECIAL CARE FACTORS FREQUENCY  PT (By licensed PT)     PT Frequency:  (5) OT Frequency:  (5)            Contractures      Additional Factors Info  Code Status, Allergies Code Status Info:  (Full Code) Allergies Info:  (Amoxicillin, Cefuroxime Axetil)           Current Medications (06/28/2016):  This is the current hospital active medication list Current Facility-Administered Medications  Medication Dose Route Frequency Provider Last Rate Last Dose  . 0.9 %  sodium chloride infusion   Intravenous Continuous Dereck Leep, MD 100 mL/hr at 06/28/16 0000    . acetaminophen (OFIRMEV) IV 1,000 mg  1,000 mg Intravenous Q6H Dereck Leep, MD   1,000 mg at 06/28/16 0546  . acetaminophen (TYLENOL) tablet 650 mg  650 mg Oral Q6H PRN Dereck Leep, MD       Or  . acetaminophen (TYLENOL) suppository 650 mg  650 mg Rectal Q6H PRN Dereck Leep, MD      . acidophilus (RISAQUAD) capsule 1 capsule  1 capsule Oral Q lunch Dereck Leep, MD      . alum & mag hydroxide-simeth (MAALOX/MYLANTA) 200-200-20 MG/5ML suspension 30 mL  30 mL Oral Q4H PRN Dereck Leep, MD      .  bisacodyl (DULCOLAX) suppository 10 mg  10 mg Rectal Daily PRN Dereck Leep, MD      . carbidopa-levodopa (SINEMET IR) 25-100 MG per tablet immediate release 1 tablet  1 tablet Oral TID Dereck Leep, MD      . celecoxib (CELEBREX) capsule 200 mg  200 mg Oral Q12H Dereck Leep, MD   200 mg at 06/27/16 2326  . cholecalciferol (VITAMIN D) tablet 2,000 Units  2,000 Units Oral BID Dereck Leep, MD   2,000 Units at 06/27/16 2326  . clindamycin (CLEOCIN) IVPB 600 mg  600 mg Intravenous Q6H Dereck Leep,  MD   600 mg at 06/28/16 0303  . diltiazem (CARDIZEM CD) 24 hr capsule 120 mg  120 mg Oral Daily Dereck Leep, MD      . diphenhydrAMINE (BENADRYL) 12.5 MG/5ML elixir 12.5-25 mg  12.5-25 mg Oral Q4H PRN Dereck Leep, MD      . enoxaparin (LOVENOX) injection 30 mg  30 mg Subcutaneous Q12H Dereck Leep, MD      . ferrous sulfate tablet 325 mg  325 mg Oral BID WC Dereck Leep, MD      . levothyroxine (SYNTHROID, LEVOTHROID) tablet 125 mcg  125 mcg Oral QAC breakfast Dereck Leep, MD      . magnesium hydroxide (MILK OF MAGNESIA) suspension 30 mL  30 mL Oral Daily PRN Dereck Leep, MD      . magnesium oxide (MAG-OX) tablet 400 mg  400 mg Oral Daily Dereck Leep, MD      . menthol-cetylpyridinium (CEPACOL) lozenge 3 mg  1 lozenge Oral PRN Dereck Leep, MD       Or  . phenol (CHLORASEPTIC) mouth spray 1 spray  1 spray Mouth/Throat PRN Dereck Leep, MD      . metoCLOPramide (REGLAN) tablet 10 mg  10 mg Oral TID AC & HS Dereck Leep, MD      . morphine 2 MG/ML injection 2 mg  2 mg Intravenous Q2H PRN Dereck Leep, MD      . multivitamin-lutein (OCUVITE-LUTEIN) capsule 1 capsule  1 capsule Oral BID Dereck Leep, MD   1 capsule at 06/27/16 2326  . ondansetron (ZOFRAN) tablet 4 mg  4 mg Oral Q6H PRN Dereck Leep, MD       Or  . ondansetron (ZOFRAN) injection 4 mg  4 mg Intravenous Q6H PRN Dereck Leep, MD      . oxyCODONE (Oxy IR/ROXICODONE) immediate release tablet 5-10 mg  5-10 mg Oral Q4H PRN Dereck Leep, MD      . pantoprazole (PROTONIX) EC tablet 40 mg  40 mg Oral BID AC Dereck Leep, MD   40 mg at 06/27/16 2326  . polyethylene glycol (MIRALAX / GLYCOLAX) packet 17 g  17 g Oral Q1400 Dereck Leep, MD      . polyvinyl alcohol (LIQUIFILM TEARS) 1.4 % ophthalmic solution 1 drop  1 drop Both Eyes TID PRN Lance Coon, MD      . ramipril (ALTACE) capsule 2.5 mg  2.5 mg Oral Daily Dereck Leep, MD      . senna-docusate (Senokot-S) tablet 1 tablet  1 tablet Oral BID Dereck Leep, MD   1 tablet at 06/27/16 2325  . sodium chloride (OCEAN) 0.65 % nasal spray 2 spray  2 spray Each Nare PRN Dereck Leep, MD      . sodium phosphate (FLEET) 7-19  GM/118ML enema 1 enema  1 enema Rectal Once PRN Dereck Leep, MD      . traMADol Veatrice Bourbon) tablet 50-100 mg  50-100 mg Oral Q4H PRN Dereck Leep, MD   100 mg at 06/28/16 0559     Discharge Medications: Please see discharge summary for a list of discharge medications.  Relevant Imaging Results:  Relevant Lab Results:   Additional Information  (SSN: [id number])  Danie Chandler, Student-Social Work

## 2016-06-28 NOTE — Evaluation (Signed)
Physical Therapy Evaluation Patient Details Name: Duane Vasquez MRN: [id number] DOB: May 15, 1926 Today's Date: 06/28/2016   History of Present Illness  83 y/o male s/p R total hip replacement, posterior approach.  Clinical Impression  Pt shows great effort t/o PT exam and is eager to work hard to be able to go home.  He has significant OA pain t/o low back and LEs and has audible crepitus with L LE movements.  Overall he did well with mobility (got to EOB w/o direct assist) and was able to rise and do some in-room ambulation with only CGA.  He had fatigue and some increased pain with the slow, guarded ambulation but ultimately was safe.  Pt did well with ~10 minutes of exercises apart from PT, pt should be able to go home on discharge as daughter plans on being available to assist.    Follow Up Recommendations Home health PT;Supervision/Assistance - 24 hour    Equipment Recommendations       Recommendations for Other Services       Precautions / Restrictions Precautions Precautions: Posterior Hip;Fall Restrictions RLE Weight Bearing: Weight bearing as tolerated      Mobility  Bed Mobility Overal bed mobility: Modified Independent             General bed mobility comments: Pt able to maintain hip precautiuons and use UEs well to get up to EOB  Transfers Overall transfer level: Modified independent Equipment used: Rolling walker (2 wheeled)             General transfer comment: Pt needed a lot of cuing for hand placement, set up and sequencing, but ultimately was able to rise to standing w/o direct assist  Ambulation/Gait Ambulation/Gait assistance: Min guard Ambulation Distance (Feet): 20 Feet Assistive device: Rolling walker (2 wheeled)       General Gait Details: Pt was able to bear weight through the R hip relatively well and though he did lean heavily on the walker he was safe and showed good relative confidence.  He had some fatigue and increased pain but  overall did well with no LOBs or significant safety concerns.   Stairs            Wheelchair Mobility    Modified Rankin (Stroke Patients Only)       Balance                                             Pertinent Vitals/Pain Pain Assessment: 0-10 Pain Score: 5  Pain Location: mostly in R hip, but also has chronic pain t/o LEs and low back    Home Living Family/patient expects to be discharged to:: Private residence Living Arrangements: Spouse/significant other Available Help at Discharge: Family   Home Access: Stairs to enter Entrance Stairs-Rails:  (yes) Entrance Stairs-Number of Steps: 2   Home Equipment: Walker - 2 wheels (has Parkinsonian walker)      Prior Function Level of Independence: Independent with assistive device(s)         Comments: Pt is able to do in-the-home activities as needed, apparently low back and general LE arthritic pain in most joints.       Hand Dominance        Extremity/Trunk Assessment   Upper Extremity Assessment Upper Extremity Assessment: Generalized weakness (b/l shoulder elevation limitations, age appropriate)    Lower Extremity Assessment Lower Extremity  Assessment: Generalized weakness (R LE grossly 3/5, otherwise age appropriate deficits t/;o)       Communication   Communication: No difficulties  Cognition Arousal/Alertness: Awake/alert Behavior During Therapy: WFL for tasks assessed/performed Overall Cognitive Status: Within Functional Limits for tasks assessed                 General Comments: pt was able to recall 3/3 hip precautions with only light cuing    General Comments      Exercises General Exercises - Lower Extremity Ankle Circles/Pumps: AROM;10 reps Quad Sets: Strengthening;10 reps Gluteal Sets: Strengthening;10 reps Heel Slides: Strengthening;10 reps;AROM Hip ABduction/ADduction: Strengthening;AROM;10 reps   Assessment/Plan    PT Assessment Patient needs  continued PT services  PT Problem List Decreased strength;Decreased range of motion;Decreased activity tolerance;Decreased balance;Decreased mobility;Decreased safety awareness;Decreased knowledge of use of DME;Decreased knowledge of precautions;Pain          PT Treatment Interventions DME instruction;Gait training;Stair training;Functional mobility training;Therapeutic activities;Therapeutic exercise;Balance training;Neuromuscular re-education;Patient/family education    PT Goals (Current goals can be found in the Care Plan section)  Acute Rehab PT Goals Patient Stated Goal: go home PT Goal Formulation: With patient Time For Goal Achievement: 07/12/16 Potential to Achieve Goals: Good    Frequency BID   Barriers to discharge        Co-evaluation               End of Session Equipment Utilized During Treatment: Gait belt Activity Tolerance: Patient tolerated treatment well Patient left: with chair alarm set;with call bell/phone within reach Nurse Communication: Mobility status         Time: 0911-0950 PT Time Calculation (min) (ACUTE ONLY): 39 min   Charges:   PT Evaluation $PT Eval Low Complexity: 1 Procedure PT Treatments $Therapeutic Exercise: 8-22 mins   PT G Codes:        Kreg Shropshire, DPT 06/28/2016, 11:29 AM

## 2016-06-28 NOTE — Progress Notes (Signed)
   Subjective: 1 Day Post-Op Procedure(s) (LRB): TOTAL HIP ARTHROPLASTY (Right) Patient reports pain as moderate.   Patient is well, and has had no acute complaints or problems We will start therapy today.  Plan is to go Home after hospital stay. no nausea and no vomiting Patient denies any chest pains or shortness of breath.  Patient was complaining of pain to the right lower extremity and foot region on the early last night states that he has some neuropathy. He developed a cramping sensation to the posterior proximal calf region. A pillow was placed under his knee and this seemed to resolve his legs. Hospitalist was contacted and he was placed on Requip. We'll discontinue this today. Does not appear to have any restless leg syndrome.  Objective: Vital signs in last 24 hours: Temp:  [97 F (36.1 C)-98.4 F (36.9 C)] 97.5 F (36.4 C) (01/30 0734) Pulse Rate:  [59-87] 73 (01/30 0734) Resp:  [13-23] 18 (01/30 0734) BP: (103-160)/(61-86) 128/69 (01/30 0734) SpO2:  [92 %-100 %] 97 % (01/30 0734) FiO2 (%):  [6 %-21 %] 21 % (01/29 2131) Weight:  [72.6 kg (160 lb)] 72.6 kg (160 lb) (01/29 1102) well approximated incision Heels are non tender and elevated off the bed using rolled towels Intake/Output from previous day: 01/29 0701 - 01/30 0700 In: 1660 [P.O.:30; I.V.:1400; IV Piggyback:100] Out: 1165 [Urine:925; Drains:140; Blood:100] Intake/Output this shift: No intake/output data recorded.   Recent Labs  06/28/16 0531  HGB 10.2*    Recent Labs  06/28/16 0531  WBC 8.9  RBC 3.33*  HCT 29.0*  PLT 225    Recent Labs  06/28/16 0531  NA 126*  K 4.0  CL 99*  CO2 23  BUN 26*  CREATININE 0.82  GLUCOSE 180*  CALCIUM 8.0*   No results for input(s): LABPT, INR in the last 72 hours.  EXAM General - Patient is Alert, Appropriate and Oriented Extremity - Neurologically intact Neurovascular intact Sensation intact distally Intact pulses distally Dorsiflexion/Plantar  flexion intact Compartment soft Dressing - dressing C/D/I Motor Function - intact, moving foot and toes well on exam.    Past Medical History:  Diagnosis Date  . Cancer [facility])    prostate  . GERD (gastroesophageal reflux disease)   . Hypertension   . Hypothyroidism   . Melanoma ([place])   . Parkinson disease ([place])   . PONV (postoperative nausea and vomiting)   . Sleep apnea   . SVT (supraventricular tachycardia) ([place])   . TIA (transient ischemic attack)     Assessment/Plan: 1 Day Post-Op Procedure(s) (LRB): TOTAL HIP ARTHROPLASTY (Right) Active Problems:   S/P total hip arthroplasty  Estimated body mass index is 23.63 kg/m as calculated from the following:   Height as of this encounter: 5\' 9"  (1.753 m).   Weight as of this encounter: 72.6 kg (160 lb). Advance diet Up with therapy D/C IV fluids Plan for discharge tomorrow Discharge home with home health  Labs: Were reviewed  DVT Prophylaxis - Lovenox, Foot Pumps and TED hose Weight-Bearing as tolerated to right  leg D/C O2 and Pulse OX and try on Room Air Begin working on bowel movement Labs tomorrow morning Requip discontinued  [name]. [place] 06/28/2016, 7:46 AM

## 2016-06-28 NOTE — Progress Notes (Signed)
Pt performed foot dangle and tolerated well. Complains of leg cramps/spasms which the pt states he has at home. Not currently on a medication for leg spasms. Pt believes it may be related to Parkinson hx. Called Md, received new orders. Moderate bloody drainage on honeycomb dressing. Pt can wiggle toes bilaterally. Toes are warm. Decreased sensation and cap refill less than 3 sec. Both lower extremeties. Using pillows for hip abduction.

## 2016-06-28 NOTE — Progress Notes (Signed)
Initial Nutrition Assessment  DOCUMENTATION CODES:   Not applicable  INTERVENTION:  1. Recommend Ensure Enlive po BID, each supplement provides 350 kcal and 20 grams of protein  NUTRITION DIAGNOSIS:   Increased nutrient needs related to wound healing as evidenced by estimated needs.  GOAL:   Patient will meet greater than or equal to 90% of their needs  MONITOR:   PO intake, I & O's, Labs, Weight trends, Supplement acceptance  REASON FOR ASSESSMENT:   Malnutrition Screening Tool    ASSESSMENT:   83 yo male who presents with Primary osteoarthritis of R Hip is now s/p R Total Hip Arthoplasty  Spoke with patient, wife at bedside. He reports he ate pancakes, orange juice, coffee this morning. Reports he did not finish it all - documented PO intake 100% thus far.  States he normally eats 2 small meals and 1 big meal at home but has dealt with some nervousness induced nausea here in the hospital - appears to be resolving Reports a gradual weight loss of 10-15# 2 years ago after prostate surgery - but denies any recent weight loss. Denies any vomiting or diarrhea, admits to some constipation.  Normally drinks Ensure at home. Requested chocolate here.  Nutrition-Focused physical exam completed. Findings are moderate fat depletion, moderate muscle depletion, and no edema.   Labs and medications reviewed: Na 126,  Mag-ox, Reglan, Iron, Vitamin D, MVI, Senokot-S NS @ 147mL/hr  Diet Order:  Diet regular Room service appropriate? Yes; Fluid consistency: Thin  Skin:  Reviewed, no issues  Last BM:  PTA  Height:   Ht Readings from Last 1 Encounters:  06/27/16 5\' 9"  (1.753 m)    Weight:   Wt Readings from Last 1 Encounters:  06/27/16 160 lb (72.6 kg)    Ideal Body Weight:  72.72 kg  BMI:  Body mass index is 23.63 kg/m.  Estimated Nutritional Needs:   Kcal:  1670-1800 (MSJ x1.2-1.3)  Protein:  72-87 gm  Fluid:  >/= 1.7L  EDUCATION NEEDS:   No education  needs identified at this time  Satira Anis. Navi Erber, MS, RD LDN Inpatient Clinical Dietitian Pager [phone]

## 2016-06-29 LAB — CBC
HCT: 25.2 % — ABNORMAL LOW (ref 40.0–52.0)
Hemoglobin: 8.6 g/dL — ABNORMAL LOW (ref 13.0–18.0)
MCH: 30.1 pg (ref 26.0–34.0)
MCHC: 34.3 g/dL (ref 32.0–36.0)
MCV: 87.9 fL (ref 80.0–100.0)
PLATELETS: 204 10*3/uL (ref 150–440)
RBC: 2.86 MIL/uL — ABNORMAL LOW (ref 4.40–5.90)
RDW: 13.9 % (ref 11.5–14.5)
WBC: 11.2 10*3/uL — AB (ref 3.8–10.6)

## 2016-06-29 LAB — BASIC METABOLIC PANEL
Anion gap: 7 (ref 5–15)
BUN: 35 mg/dL — ABNORMAL HIGH (ref 6–20)
CO2: 22 mmol/L (ref 22–32)
CREATININE: 1.04 mg/dL (ref 0.61–1.24)
Calcium: 8.1 mg/dL — ABNORMAL LOW (ref 8.9–10.3)
Chloride: 99 mmol/L — ABNORMAL LOW (ref 101–111)
GFR calc Af Amer: >60 mL/min (ref >60)
GFR calc non Af Amer: >60 mL/min (ref >60)
GLUCOSE: 114 mg/dL — AB (ref 65–99)
Potassium: 4.3 mmol/L (ref 3.5–5.1)
Sodium: 128 mmol/L — ABNORMAL LOW (ref 135–145)

## 2016-06-29 LAB — SURGICAL PATHOLOGY

## 2016-06-29 NOTE — Progress Notes (Signed)
   Subjective: 2 Days Post-Op Procedure(s) (LRB): TOTAL HIP ARTHROPLASTY (Right) Patient reports pain as mild.   Patient is well, and has had no acute complaints or problems Continue with physical therapy today.  Plan is to go Home after hospital stay. no nausea and no vomiting Patient denies any chest pains or shortness of breath. Objective: Vital signs in last 24 hours: Temp:  [97.5 F (36.4 C)-98.4 F (36.9 C)] 98.4 F (36.9 C) (01/31 0440) Pulse Rate:  [71-73] 71 (01/31 0440) Resp:  [18-19] 19 (01/31 0440) BP: (118-128)/(59-69) 127/68 (01/31 0440) SpO2:  [95 %-97 %] 95 % (01/31 0440) well approximated incision Heels are non tender and elevated off the bed using rolled towels Intake/Output from previous day: 01/30 0701 - 01/31 0700 In: 720 [P.O.:720] Out: 580 [Urine:510; Drains:70] Intake/Output this shift: No intake/output data recorded.   Recent Labs  06/28/16 0531 06/29/16 0415  HGB 10.2* 8.6*    Recent Labs  06/28/16 0531 06/29/16 0415  WBC 8.9 11.2*  RBC 3.33* 2.86*  HCT 29.0* 25.2*  PLT 225 204    Recent Labs  06/28/16 0531 06/29/16 0415  NA 126* 128*  K 4.0 4.3  CL 99* 99*  CO2 23 22  BUN 26* 35*  CREATININE 0.82 1.04  GLUCOSE 180* 114*  CALCIUM 8.0* 8.1*   No results for input(s): LABPT, INR in the last 72 hours.  EXAM General - Patient is Alert, Appropriate and Oriented Extremity - Neurologically intact Neurovascular intact Sensation intact distally Intact pulses distally Dorsiflexion/Plantar flexion intact No cellulitis present Compartment soft Dressing - moderate drainage to the posterior dressing. Motor Function - intact, moving foot and toes well on exam.    Past Medical History:  Diagnosis Date  . Cancer [facility])    prostate  . GERD (gastroesophageal reflux disease)   . Hypertension   . Hypothyroidism   . Melanoma ([place])   . Parkinson disease ([place])   . PONV (postoperative nausea and vomiting)   . Sleep apnea   . SVT  (supraventricular tachycardia) ([place])   . TIA (transient ischemic attack)     Assessment/Plan: 2 Days Post-Op Procedure(s) (LRB): TOTAL HIP ARTHROPLASTY (Right) Active Problems:   S/P total hip arthroplasty  Estimated body mass index is 23.63 kg/m as calculated from the following:   Height as of this encounter: 5\' 9"  (1.753 m).   Weight as of this encounter: 72.6 kg (160 lb). Up with therapy Discharge home with home health possibly this afternoon or tomorrow morning. Patient still needs to do his lap around the nurse's desk, do steps and have a bowel movement.  Labs: Were reviewed. Sodium remained low but after looking at his previous labs this is about his baseline. DVT Prophylaxis - Lovenox, Foot Pumps and TED hose Weight-Bearing as tolerated to right leg Patient needs a bowel movement today. Please change dressing this afternoon. Be sure that TED stockings on both legs.  [name]. [place] 06/29/2016, 7:12 AM

## 2016-06-29 NOTE — Progress Notes (Signed)
Instructed pt on self administration of lovenox. Pt administered his injection and tolerated well but needs re-enforcement. Had difficulty executing the sharps safety device.

## 2016-06-29 NOTE — Progress Notes (Signed)
Physical Therapy Treatment Patient Details Name: Duane Vasquez MRN: [id number] DOB: 1926-05-03 Today's Date: 06/29/2016    History of Present Illness 83 y/o male s/p R total hip replacement, posterior approach. PMHx significant for Parkinson's, peripheral neuropathy, DDD, TIA, HTN, GERD, prostate cancer, sleep apnea, SVT.    PT Comments    Pt agreeable to PT; reports 3-6/10 pain in R hip, shin and foot. Distal lower extremity due to neuropathy pain. Pt's daughter present for session. Pt/daughter have numerous (and repetitive) questions regarding hip precautions, exercises, stairs and home therapy. Extensive education on all. Pt does improve ambulation distance and overall quality today and demonstrates stair climbing. Parkinson symptoms and arthritic left hip inhibit overall function and quality. Ambulation with Min guard and cues for avoiding toe in on right; sequencing, step lengths and weight shifting sporadic. Min a required on steps. They have many questions regarding best stairs/railings and assistive devices/assist to use at home. Practiced, educated and demonstrated various methods, but ultimately informed pt/family that least amount of steps with 1 rail and cane as previously performed and practiced during hospital stay would serve best initially and further evaluation/assessment to be performed by home therapist. Pt received up in chair comfortably. Continue PT for progression of strength, endurance, adherence of posterior hip precautions and application with function to allow for optimal/safe return home.   Follow Up Recommendations  Home health PT;Supervision/Assistance - 24 hour     Equipment Recommendations  3in1 (PT)    Recommendations for Other Services       Precautions / Restrictions Precautions Precautions: Posterior Hip;Fall (Remembers 2/3, but extensive question/answer session ) Restrictions Weight Bearing Restrictions: Yes RLE Weight Bearing: Weight bearing as  tolerated    Mobility  Bed Mobility Overal bed mobility: Modified Independent             General bed mobility comments: use of rails; mild increased time  Transfers Overall transfer level: Needs assistance Equipment used: Rolling walker (2 wheeled) Transfers: Sit to/from Stand Sit to Stand: Min guard         General transfer comment: Mild increased time to steady; more so from mild shaking from Parkinsons  Ambulation/Gait Ambulation/Gait assistance: Min guard Ambulation Distance (Feet): 165 Feet Assistive device: Rolling walker (2 wheeled) Gait Pattern/deviations: Step-through pattern;Decreased step length - left;Decreased stance time - right;Decreased dorsiflexion - right;Narrow base of support;Drifts right/left   Gait velocity interpretation: Below normal speed for age/gender General Gait Details: Difficulty with sequencing due to Parkinsons symptoms. Mildly sporadic with step lengths and sequencing less than rytnmical, but functional without LOB. Cues for avoiding toe in on R   Stairs Stairs: Yes   Stair Management: One rail Right;Step to pattern;With cane Number of Stairs: 4 General stair comments: L hip is also very arthritic. Cues for sequence and mild increased assist for balance/steadiness  Wheelchair Mobility    Modified Rankin (Stroke Patients Only)       Balance Overall balance assessment: Needs assistance Sitting-balance support: Feet supported Sitting balance-Leahy Scale: Good     Standing balance support: Bilateral upper extremity supported Standing balance-Leahy Scale: Fair                      Cognition Arousal/Alertness: Awake/alert Behavior During Therapy: WFL for tasks assessed/performed Overall Cognitive Status: Within Functional Limits for tasks assessed                      Exercises Total Joint Exercises Ankle Circles/Pumps: AROM;Both;20 reps [employer]  Sets: Strengthening;Both;20 reps Gluteal Sets:  Strengthening;Both;20 reps Towel Squeeze: Strengthening;Both;20 reps Heel Slides: AAROM;20 reps;Both Hip ABduction/ADduction: AAROM;Both;20 reps Long Arc Quad: AAROM;Right;20 reps;Seated (AROM L) Other Exercises Other Exercises: AAROM/AROM ER R hip, long sit Other Exercises: Extensive edcuation provided to pt and daughter.  Alll questions answered numerous times    General Comments        Pertinent Vitals/Pain Pain Assessment: 0-10 Pain Score: 3  (up to a 6 at times) Pain Location: R hip, shin and foot  Pain Descriptors / Indicators: Pins and needles;Operative site guarding;Aching;Constant Pain Intervention(s): Monitored during session;Premedicated before session;Repositioned    Home Living                      Prior Function            PT Goals (current goals can now be found in the care plan section) Progress towards PT goals: Progressing toward goals    Frequency    BID      PT Plan Current plan remains appropriate    Co-evaluation             End of Session Equipment Utilized During Treatment: Gait belt Activity Tolerance: Patient tolerated treatment well Patient left: in chair;with call bell/phone within reach;with chair alarm set;with family/visitor present     Time: [id number] PT Time Calculation (min) (ACUTE ONLY): 53 min  Charges:  $Gait Training: 23-37 mins $Therapeutic Exercise: 23-37 mins                    G CodesLarae Grooms, PTA 06/29/2016, 12:53 PM

## 2016-06-29 NOTE — Progress Notes (Signed)
Physical Therapy Treatment Patient Details Name: Duane Vasquez MRN: [id number] DOB: Jun 25, 1925 Today's Date: 06/29/2016    History of Present Illness 83 y/o male s/p R total hip replacement, posterior approach. PMHx significant for Parkinson's, peripheral neuropathy, DDD, TIA, HTN, GERD, prostate cancer, sleep apnea, SVT.    PT Comments    Duane Vasquez continues to make good progress with PT.  Reviewed hip precautions as pt only able to recall 2/3 this session.  Daughter provided with precaution and exercise handout for pt to reference.  He ambulated 100 ft with min guard assist for safety and tolerated all interventions well.     Follow Up Recommendations  Home health PT;Supervision/Assistance - 24 hour     Equipment Recommendations  3in1 (PT)    Recommendations for Other Services       Precautions / Restrictions Precautions Precautions: Posterior Hip;Fall Precaution Booklet Issued: Yes (comment) Precaution Comments: Provided pt/daughter with another precaution and excercise handout.  Pt able to recall 2/3 precautions, omitting no IR.   Restrictions Weight Bearing Restrictions: Yes RLE Weight Bearing: Weight bearing as tolerated    Mobility  Bed Mobility Overal bed mobility: Modified Independent             General bed mobility comments: Slightly increased time.  No cues or physical assist needed.  Transfers Overall transfer level: Needs assistance Equipment used: Rolling walker (2 wheeled) Transfers: Sit to/from Stand Sit to Stand: Min guard         General transfer comment: Slow to stand but no cues needed for proper hand placement with sit>stand.  To sit pt reaches back for bed with one hand but demonstrates poorly controlled descent and pt educated on proper technique.  Ambulation/Gait Ambulation/Gait assistance: Min guard Ambulation Distance (Feet): 100 Feet Assistive device: Rolling walker (2 wheeled) Gait Pattern/deviations: Step-through  pattern;Decreased step length - left;Decreased stance time - right;Decreased dorsiflexion - right;Trunk flexed Gait velocity: decreased Gait velocity interpretation: Below normal speed for age/gender General Gait Details: Flexed posture (this is pt's baseline) with cues to stand upright as much as he is able to tolerate.  Cues to always turn to his L when he has the option for R or L.     Stairs Stairs: Yes   Stair Management: One rail Right;Step to pattern;With cane Number of Stairs: 4 General stair comments: L hip is also very arthritic. Cues for sequence and mild increased assist for balance/steadiness  Wheelchair Mobility    Modified Rankin (Stroke Patients Only)       Balance Overall balance assessment: Needs assistance Sitting-balance support: Feet supported;No upper extremity supported Sitting balance-Leahy Scale: Good     Standing balance support: Bilateral upper extremity supported;During functional activity Standing balance-Leahy Scale: Poor Standing balance comment: Relies on RW for support                    Cognition Arousal/Alertness: Awake/alert Behavior During Therapy: WFL for tasks assessed/performed Overall Cognitive Status: Within Functional Limits for tasks assessed                 General Comments: pt able to recall 3/3 hip precautions with 1 cue    Exercises Total Joint Exercises Ankle Circles/Pumps: AROM;Both;10 reps;Supine Quad Sets: Strengthening;Both;10 reps;Supine Gluteal Sets: Strengthening;Both;10 reps;Supine Towel Squeeze: Strengthening;Both;20 reps Short Arc Quad: Strengthening;Right;10 reps;Supine Heel Slides: AAROM;20 reps;Both Hip ABduction/ADduction: Strengthening;Right;10 reps;Supine Long Arc Quad: AAROM;Right;20 reps;Seated (AROM L) Other Exercises Other Exercises: pt, dtr, and spouse educated in cognitive behavioral pain coping strategies  to support chronic pain mgt and pt verbalized/demonstrated understanding of  learned techniques (progressive relaxation, PLB, pleasant imagery, distraction) Other Exercises: Extensive edcuation provided to pt and daughter.  Alll questions answered numerous times    General Comments General comments (skin integrity, edema, etc.): Daughter and wife present throughout session      Pertinent Vitals/Pain Pain Assessment: 0-10 Pain Score: 5  Pain Location: R hip Pain Descriptors / Indicators: Operative site guarding;Aching Pain Intervention(s): Limited activity within patient's tolerance;Monitored during session;Repositioned    Home Living                      Prior Function            PT Goals (current goals can now be found in the care plan section) Acute Rehab PT Goals Patient Stated Goal: to go home PT Goal Formulation: With patient/family Time For Goal Achievement: 07/12/16 Potential to Achieve Goals: Good Progress towards PT goals: Progressing toward goals    Frequency    BID      PT Plan Current plan remains appropriate    Co-evaluation             End of Session Equipment Utilized During Treatment: Gait belt Activity Tolerance: Patient tolerated treatment well Patient left: with call bell/phone within reach;with family/visitor present;in bed;with bed alarm set;Other (comment) (will pillows in place of hip abbduction pillow)     Time: [id number] PT Time Calculation (min) (ACUTE ONLY): 23 min  Charges:  $Gait Training: 8-22 mins $Therapeutic Exercise: 8-22 mins                    G Codes:       Duane Vasquez PT, DPT 06/29/2016, 4:27 PM

## 2016-06-29 NOTE — Progress Notes (Signed)
Occupational Therapy Treatment Patient Details Name: Duane Vasquez MRN: [id number] DOB: [birth year] Today's Date: 06/29/2016    History of present illness 83 y/o male s/p R total hip replacement, posterior approach. PMHx significant for Parkinson's, peripheral neuropathy, DDD, TIA, HTN, GERD, prostate cancer, sleep apnea, SVT.   OT comments  Pt progressing nicely towards OT goals. Pt/spouse/dtr educated in A/E and compensatory strategies for LB ADL tasks as well as cognitive behavioral pain coping strategies to support chronic pain mgt. All very thankful for information and training. Spouse feels confident with training provided in assisting with LB dressing tasks including TED hose while seated in a chair (to protect her back) and support pt adhering to hip precautions. Pt was also able to demonstrate AE techniques with reacher, sock aid, and long handled shoe horn for LB dressing while adhering to hip precautions with light verbal cues from OT and spouse for hip precautions. Continue to recommend HHOT for home safety assessment and additional home and ADL routines modification recommendations to maximize functional independence and minimize future falls risk.    Follow Up Recommendations  Home health OT    Equipment Recommendations  Tub/shower seat;Toilet rise with handles;Toilet riser;Other (comment)    Recommendations for Other Services      Precautions / Restrictions Precautions Precautions: Posterior Hip;Fall Restrictions Weight Bearing Restrictions: Yes RLE Weight Bearing: Weight bearing as tolerated       Mobility Bed Mobility  Transfers              Balance Overall balance assessment: Needs assistance Sitting-balance support: Feet supported Sitting balance-Leahy Scale: Good                         ADL Overall ADL's : Needs assistance/impaired                 Upper Body Dressing : Sitting;Set up   Lower Body Dressing: Sitting/lateral  leans;With adaptive equipment;Cueing for compensatory techniques;Cueing for safety;Set up;Supervision/safety Lower Body Dressing Details (indicate cue type and reason): cueing for hip precautions and compensatory techniques with AE with education/training of spouse and dtr to assist pt with LB dressing including TED hose                      Vision                     Perception     Praxis      Cognition   Behavior During Therapy: [facility] for tasks assessed/performed Overall Cognitive Status: Within Functional Limits for tasks assessed                  General Comments: pt able to recall 3/3 hip precautions with 1 cue    Extremity/Trunk Assessment               Exercises  Other Exercises Other Exercises: pt, dtr, and spouse educated in cognitive behavioral pain coping strategies to support chronic pain mgt and pt verbalized/demonstrated understanding of learned techniques (progressive relaxation, PLB, pleasant imagery, distraction)    Shoulder Instructions       General Comments      Pertinent Vitals/ Pain       Pain Assessment: 0-10 Pain Score: 5  Pain Location: R hip Pain Descriptors / Indicators: Pins and needles;Operative site guarding;Aching;Constant Pain Intervention(s): Limited activity within patient's tolerance;Monitored during session;Utilized relaxation techniques  Home Living  Prior Functioning/Environment              Frequency           Progress Toward Goals  OT Goals(current goals can now be found in the care plan section)  Progress towards OT goals: Progressing toward goals  Acute Rehab OT Goals Patient Stated Goal: go home OT Goal Formulation: With patient Time For Goal Achievement: 07/12/16 Potential to Achieve Goals: Good  Plan Discharge plan remains appropriate    Co-evaluation                 End of Session     Activity Tolerance  Patient tolerated treatment well   Patient Left in chair;with call bell/phone within reach;with chair alarm set;with family/visitor present (with PT on the way for session)   Nurse Communication          Time: [id number] OT Time Calculation (min): 49 min  Charges: OT General Charges $OT Visit: 1 Procedure OT Treatments $Self Care/Home Management : 38-52 mins  Corky Sox, OTR/L 06/29/2016, 4:02 PM

## 2016-06-30 MED ORDER — ENOXAPARIN SODIUM 40 MG/0.4ML ~~LOC~~ SOLN: 40.0000 mg | SUBCUTANEOUS | 0 refills | Status: DC

## 2016-06-30 MED ORDER — TRAMADOL HCL 50 MG PO TABS: 50.0000 mg | ORAL_TABLET | ORAL | 0 refills | Status: DC | PRN

## 2016-06-30 MED ORDER — OXYCODONE HCL 5 MG PO TABS: 5.0000 mg | ORAL_TABLET | ORAL | 0 refills | Status: DC | PRN

## 2016-06-30 NOTE — Progress Notes (Deleted)
Received a call from Dr Carroll from anesthesia, informing that he touched based with Dr Oaks. No procedure will be done today per above MD and ordered pt to be off NPO. Will carry out as ordered. 

## 2016-06-30 NOTE — Progress Notes (Signed)
Physical Therapy Treatment Patient Details Name: Duane Vasquez MRN: [id number] DOB: [birth year] Today's Date: 06/30/2016    History of Present Illness 83 y/o male s/p R total hip replacement, posterior approach. PMHx significant for Parkinson's, peripheral neuropathy, DDD, TIA, HTN, GERD, prostate cancer, sleep apnea, SVT.    PT Comments    Pt sitting edge of bed with nurse upon arrival.  Stood with min guard and was able to ambulate to PT gym for stair training with left rail and right cane.  Pt able to recall sequencing.  Daughter in and observed stair training today and felt more confident with discharge.  He then continued to ambulate in hallway for a total of 130' with min guard.  Participated in exercises as described below.  Discussed discharge plan and expectations with pt and daughter.  Encouraged +1 assist with mobility upon discharge for safety.   Follow Up Recommendations  Home health PT;Supervision/Assistance - 24 hour     Equipment Recommendations  3in1 (PT)    Recommendations for Other Services       Precautions / Restrictions Precautions Precautions: Posterior Hip;Fall Restrictions Weight Bearing Restrictions: Yes RLE Weight Bearing: Weight bearing as tolerated    Mobility  Bed Mobility Overal bed mobility: Modified Independent             General bed mobility comments: Slightly increased time.  No cues or physical assist needed.  Transfers Overall transfer level: Needs assistance Equipment used: Rolling walker (2 wheeled) Transfers: Sit to/from Stand Sit to Stand: Min guard         General transfer comment: Good hand placements today and good control  Ambulation/Gait Ambulation/Gait assistance: Min guard Ambulation Distance (Feet): 130 Feet Assistive device: Rolling walker (2 wheeled) Gait Pattern/deviations: Step-through pattern;Decreased step length - left;Decreased stance time - right;Decreased dorsiflexion - right;Trunk flexed Gait  velocity: decreased Gait velocity interpretation: Below normal speed for age/gender     Stairs Stairs: Yes   Stair Management: One rail Right;Step to pattern;With cane Number of Stairs: 4 General stair comments: able to recall sequence without assist today.  daughter observed stair training and will assist upon d/c  Wheelchair Mobility    Modified Rankin (Stroke Patients Only)       Balance Overall balance assessment: Needs assistance Sitting-balance support: Feet supported;No upper extremity supported Sitting balance-Leahy Scale: Good     Standing balance support: Bilateral upper extremity supported;During functional activity Standing balance-Leahy Scale: Fair Standing balance comment: Relies on RW for support                    Cognition Arousal/Alertness: Awake/alert Behavior During Therapy: WFL for tasks assessed/performed Overall Cognitive Status: Within Functional Limits for tasks assessed                      Exercises Total Joint Exercises Ankle Circles/Pumps: AROM;Both;10 reps;Supine Quad Sets: Strengthening;Both;10 reps;Supine Gluteal Sets: Strengthening;Both;10 reps;Supine Short Arc Quad: Strengthening;Right;10 reps;Supine Heel Slides: AROM;10 reps;Right;Supine Hip ABduction/ADduction: Strengthening;Right;10 reps;Supine Long Arc Quad: Right;Seated;AROM;10 reps    General Comments        Pertinent Vitals/Pain Pain Assessment: 0-10 Pain Score: 4  Pain Descriptors / Indicators: Operative site guarding;Aching Pain Intervention(s): Limited activity within patient's tolerance;Repositioned    Home Living                      Prior Function            PT Goals (current goals can now be  found in the care plan section) Progress towards PT goals: Progressing toward goals    Frequency    BID      PT Plan Current plan remains appropriate    Co-evaluation             End of Session Equipment Utilized During  Treatment: Gait belt Activity Tolerance: Patient tolerated treatment well Patient left: with call bell/phone within reach;with family/visitor present;in bed;with bed alarm set;Other (comment)     Time: [id number] PT Time Calculation (min) (ACUTE ONLY): 19 min  Charges:  $Gait Training: 8-22 mins                    G Codes:      Chesley Noon 07-18-16, 9:58 AM

## 2016-06-30 NOTE — Care Management Important Message (Signed)
Important Message  Patient Details  Name: Duane Vasquez MRN: [id number] Date of Birth: 08-18-1925   Medicare Important Message Given:  Yes    Jolly Mango, RN 06/30/2016, 9:03 AM

## 2016-06-30 NOTE — Care Management Note (Addendum)
Case Management Note  Patient Details  Name: Duane Vasquez MRN: [id number] Date of Birth: 1926/05/22  Subjective/Objective:   Met with patient and daughter at bedside to updated on Lovenox cost.   [facility] notified of discharge. Lovenox is $20.Both are agreeable to POC.                 Action/Plan: 3 in 1 delivered  Expected Discharge Date:  06/30/16               Expected Discharge Plan:  [place]  In-House Referral:     Discharge planning Services  CM Consult  Post Acute Care Choice:  Durable Medical Equipment, Home Health Choice offered to:  Patient, Spouse  DME Arranged:  3-N-1 DME Agency:  [place]:  PT [place]:  [facility] (now [facility])  Status of Service:  Completed, signed off  If discussed at [place] of Stay Meetings, dates discussed:    Additional Comments:  Jolly Mango, RN 06/30/2016, 9:00 AM

## 2016-06-30 NOTE — Progress Notes (Signed)
   Subjective: 3 Days Post-Op Procedure(s) (LRB): TOTAL HIP ARTHROPLASTY (Right) Patient reports pain as mild.   Patient is well, and has had no acute complaints or problems Continue with physical therapy today.  Plan is to go Home after hospital stay. no nausea and no vomiting Patient denies any chest pains or shortness of breath. Objective: Vital signs in last 24 hours: Temp:  [97.6 F (36.4 C)-98.7 F (37.1 C)] 98.7 F (37.1 C) (02/01 0537) Pulse Rate:  [67-79] 77 (02/01 0537) Resp:  [18-19] 19 (02/01 0537) BP: (111-144)/(61-84) 144/84 (02/01 0537) SpO2:  [95 %-97 %] 95 % (02/01 0537) well approximated incision Heels are non tender and elevated off the bed using rolled towels Intake/Output from previous day: 01/31 0701 - 02/01 0700 In: 1440 [P.O.:1440] Out: 1205 [Urine:1205] Intake/Output this shift: No intake/output data recorded.   Recent Labs  06/28/16 0531 06/29/16 0415  HGB 10.2* 8.6*    Recent Labs  06/28/16 0531 06/29/16 0415  WBC 8.9 11.2*  RBC 3.33* 2.86*  HCT 29.0* 25.2*  PLT 225 204    Recent Labs  06/28/16 0531 06/29/16 0415  NA 126* 128*  K 4.0 4.3  CL 99* 99*  CO2 23 22  BUN 26* 35*  CREATININE 0.82 1.04  GLUCOSE 180* 114*  CALCIUM 8.0* 8.1*   No results for input(s): LABPT, INR in the last 72 hours.  EXAM General - Patient is Alert, Appropriate and Oriented Extremity - Neurologically intact Neurovascular intact Sensation intact distally Intact pulses distally Dorsiflexion/Plantar flexion intact No cellulitis present Compartment soft Dressing - moderate drainage posterior aspect of dressing Motor Function - intact, moving foot and toes well on exam.    Past Medical History:  Diagnosis Date  . Cancer [facility])    prostate  . GERD (gastroesophageal reflux disease)   . Hypertension   . Hypothyroidism   . Melanoma ([place])   . Parkinson disease ([place])   . PONV (postoperative nausea and vomiting)   . Sleep apnea   . SVT  (supraventricular tachycardia) ([place])   . TIA (transient ischemic attack)     Assessment/Plan: 3 Days Post-Op Procedure(s) (LRB): TOTAL HIP ARTHROPLASTY (Right) Active Problems:   S/P total hip arthroplasty  Estimated body mass index is 23.63 kg/m as calculated from the following:   Height as of this encounter: 5\' 9"  (1.753 m).   Weight as of this encounter: 72.6 kg (160 lb). Up with therapy Discharge home with home health  Labs: None DVT Prophylaxis - Lovenox, Foot Pumps and TED hose Weight-Bearing as tolerated to right leg Patient may be discharged to home once she does physical therapy this morning. Please change dressing prior to being discharged and give the patient 2 extra honeycomb dressings to take home.  Duane Vasquez. Duane Vasquez [place] 06/30/2016, 7:09 AM

## 2017-02-09 ENCOUNTER — Encounter
Admission: RE | Admit: 2017-02-09 | Discharge: 2017-02-09 | Disposition: A | Discharge status: Home / Self Care | Payer: Medicare Other | Source: Ambulatory Visit | Attending: Orthopedic Surgery | Admitting: Orthopedic Surgery

## 2017-02-09 DIAGNOSIS — Z01818 Encounter for other preprocedural examination: Secondary | ICD-10-CM | POA: Diagnosis not present

## 2017-02-09 DIAGNOSIS — M1612 Unilateral primary osteoarthritis, left hip: Secondary | ICD-10-CM | POA: Insufficient documentation

## 2017-02-09 HISTORY — DX: Cardiac arrhythmia, unspecified: I49.9

## 2017-02-09 HISTORY — DX: Unspecified osteoarthritis, unspecified site: M19.90

## 2017-02-09 HISTORY — DX: Unspecified hearing loss, unspecified ear: H91.90

## 2017-02-09 HISTORY — DX: Dyspnea, unspecified: R06.00

## 2017-02-09 LAB — PROTIME-INR
INR: 1.08
Prothrombin Time: 13.9 seconds (ref 11.4–15.2)

## 2017-02-09 LAB — CBC WITH DIFFERENTIAL/PLATELET
Basophils Absolute: 0 10*3/uL (ref 0–0.1)
Basophils Relative: 0 %
Eosinophils Absolute: 0.1 10*3/uL (ref 0–0.7)
Eosinophils Relative: 1 %
HCT: 41 % (ref 40.0–52.0)
Hemoglobin: 14.1 g/dL (ref 13.0–18.0)
LYMPHS ABS: 1 10*3/uL (ref 1.0–3.6)
Lymphocytes Relative: 16 %
MCH: 30.3 pg (ref 26.0–34.0)
MCHC: 34.3 g/dL (ref 32.0–36.0)
MCV: 88.3 fL (ref 80.0–100.0)
Monocytes Absolute: 0.6 10*3/uL (ref 0.2–1.0)
Monocytes Relative: 10 %
Neutro Abs: 4.6 10*3/uL (ref 1.4–6.5)
Neutrophils Relative %: 73 %
PLATELETS: 264 10*3/uL (ref 150–440)
RBC: 4.64 MIL/uL (ref 4.40–5.90)
RDW: 14.4 % (ref 11.5–14.5)
WBC: 6.4 10*3/uL (ref 3.8–10.6)

## 2017-02-09 LAB — URINALYSIS, ROUTINE W REFLEX MICROSCOPIC
Bilirubin Urine: NEGATIVE
GLUCOSE, UA: NEGATIVE mg/dL
KETONES UR: NEGATIVE mg/dL
Leukocytes, UA: NEGATIVE
Nitrite: NEGATIVE
PH: 7.5 (ref 5.0–8.0)
PROTEIN: NEGATIVE mg/dL
Specific Gravity, Urine: 1.02 (ref 1.005–1.030)

## 2017-02-09 LAB — COMPREHENSIVE METABOLIC PANEL
ALT: 13 U/L — AB (ref 17–63)
ANION GAP: 9 (ref 5–15)
AST: 18 U/L (ref 15–41)
Albumin: 4.3 g/dL (ref 3.5–5.0)
Alkaline Phosphatase: 97 U/L (ref 38–126)
BUN: 22 mg/dL — ABNORMAL HIGH (ref 6–20)
CHLORIDE: 97 mmol/L — AB (ref 101–111)
CO2: 27 mmol/L (ref 22–32)
Calcium: 9.5 mg/dL (ref 8.9–10.3)
Creatinine, Ser: 0.79 mg/dL (ref 0.61–1.24)
GFR calc non Af Amer: >60 mL/min (ref >60)
Glucose, Bld: 94 mg/dL (ref 65–99)
POTASSIUM: 3.8 mmol/L (ref 3.5–5.1)
SODIUM: 133 mmol/L — AB (ref 135–145)
Total Bilirubin: 0.7 mg/dL (ref 0.3–1.2)
Total Protein: 7.8 g/dL (ref 6.5–8.1)

## 2017-02-09 LAB — TYPE AND SCREEN
ABO/RH(D): A POS
ANTIBODY SCREEN: NEGATIVE

## 2017-02-09 LAB — URINALYSIS, MICROSCOPIC (REFLEX)

## 2017-02-09 LAB — C-REACTIVE PROTEIN: CRP: <0.8 mg/dL (ref <1.0)

## 2017-02-09 LAB — SURGICAL PCR SCREEN
MRSA, PCR: NEGATIVE
STAPHYLOCOCCUS AUREUS: NEGATIVE

## 2017-02-09 LAB — APTT: aPTT: 28 seconds (ref 24–36)

## 2017-02-09 LAB — SEDIMENTATION RATE: Sed Rate: 15 mm/hr (ref 0–20)

## 2017-02-09 NOTE — Patient Instructions (Addendum)
Your procedure is scheduled on: 02/22/17 Wed Report to Same Day Surgery 2nd floor medical mall [facility] Entrance-take elevator on left to 2nd floor.  Check in with surgery information desk.) To find out your arrival time please call [phone] between 1PM - 3PM on 02/21/17 Tues  Remember: Instructions that are not followed completely may result in serious medical risk, up to and including death, or upon the discretion of your surgeon and anesthesiologist your surgery may need to be rescheduled.    _x___ 1. Do not eat food after midnight the night before your procedure. You may drink clear liquids up to 2 hours before you are scheduled to arrive at the hospital for your procedure.  Do not drink clear liquids within 2 hours of your scheduled arrival to the hospital.  Clear liquids include  --Water or Apple juice without pulp  --Clear carbohydrate beverage such as ClearFast or Gatorade  --Black Coffee or Clear Tea (No milk, no creamers, do not add anything to                  the coffee or Tea Type 1 and type 2 diabetics should only drink water.  No gum chewing or hard candies.     __x__ 2. No Alcohol for 24 hours before or after surgery.   __x__3. No Smoking for 24 prior to surgery.   ____  4. Bring all medications with you on the day of surgery if instructed.    __x__ 5. Notify your doctor if there is any change in your medical condition     (cold, fever, infections).     Do not wear jewelry, make-up, hairpins, clips or nail polish.  Do not wear lotions, powders, or perfumes. You may wear deodorant.  Do not shave 48 hours prior to surgery. Men may shave face and neck.  Do not bring valuables to the hospital.    [facility] is not responsible for any belongings or valuables.               Contacts, dentures or bridgework may not be worn into surgery.  Leave your suitcase in the car. After surgery it may be brought to your room.  For patients admitted to the hospital, discharge  time is determined by your                       treatment team.   Patients discharged the day of surgery will not be allowed to drive home.  You will need someone to drive you home and stay with you the night of your procedure.    Please read over the following fact sheets that you were given:   [facility] Preparing for Surgery and or MRSA Information   _x___ Take anti-hypertensive listed below, cardiac, seizure, asthma,     anti-reflux and psychiatric medicines. These include:  1. carbidopa-levodopa (SINEMET IR) 25-100 MG tablet  2.diltiazem (CARDIZEM CD) 180 MG 24 hr capsule  3.levothyroxine (SYNTHROID) 125 MCG tablet  4.  5.  6.  ____Fleets enema or Magnesium Citrate as directed.   _x___ Use CHG Soap or sage wipes as directed on instruction sheet   ____ Use inhalers on the day of surgery and bring to hospital day of surgery  ____ Stop Metformin and Janumet 2 days prior to surgery.    ____ Take 1/2 of usual insulin dose the night before surgery and none on the morning     surgery.   _x___  Follow recommendations from Cardiologist, Pulmonologist or PCP regarding          stopping Aspirin, Coumadin, Plavix ,Eliquis, Effient, or Pradaxa, and Pletal.  Stop Aspirin 1 week before surgery if OK with physician  X____Stop Anti-inflammatories such as Advil, Aleve, Ibuprofen, Motrin, Naproxen, Naprosyn, Goodies powders or aspirin products. OK to take Tylenol and                          Celebrex.   _x___ Stop supplements until after surgery.  But may continue Vitamin D, Vitamin B,       and multivitamin.   _x___ Bring C-Pap to the hospital.

## 2017-02-09 NOTE — Pre-Procedure Instructions (Signed)
Negative Lexiscan stress.LV function 53%.No evidence of reversible  ischemia.Low risk study.  Result Narrative  CARDIOLOGY DEPARTMENT [facility] [address] [place], [id number] [phone]  Procedure: Pharmacologic Myocardial Perfusion Imaging ONE day procedure  Indication: Chest pain at rest, unspecified Plan: NM myocardial perfusion SPECT multiple (stress  and rest), ECG stress test only  Ordering Physician:   Dr. Bartholome Bill   Clinical History: 83 y.o. year old male Vitals: Height: 54 in Weight: 172 lb Cardiac risk factors include:  CAS, Hyperlipidemia, HTN and CAD    Procedure:  Pharmacologic stress testing was performed with Regadenoson using a single  use 0.4mg /93ml (0.08 mg/ml) prefilled syringe intravenously infused as a  bolus dose. The stress test was stopped due to Infusion completion.Blood  pressure response was normal. The patient did not develop any symptoms  other than fatigue during the procedure.   Rest HR: 96bpm Rest BP: 136/104mmHg Max HR: 129bpm Min BP: 112/43mmHg  Stress Test Administered by: Oswald Hillock, CMA  ECG Interpretation: Rest XID:[id number] fibrillation, none Stress OHF:GBMSXJ fibrillation, no arrhythmia or ischemia Recovery DBZ:MCEYEM fibrillation ECG Interpretation:non-diagnostic due to pharmacologic testing.   Administrations This Visit  regadenoson (LEXISCAN) 0.4 mg/5 mL inj syringe 0.4 mg  Admin Date 02/01/2017 Action Given Dose 0.4 mg Route Intravenous Administered By Kingsley Callander, CNMT

## 2017-02-09 NOTE — Pre-Procedure Instructions (Signed)
Health System Component Name Value Ref Range  LV Ejection Fraction (%) 55   Aortic Valve Regurgitation Grade trivial   Aortic Valve Stenosis Grade none   Aortic Valve Max Velocity (m/s) 1.6 m/sec  Aortic Valve Stenosis Mean Gradient (mmHg) 4.0 mmHg  Mitral Valve Regurgitation Grade mild   Mitral Valve Stenosis Grade none   Tricuspid Valve Regurgitation Grade mild   Tricuspid Valve Regurgitation Max Velocity (m/s) 2.7 m/sec  Right Ventricle Systolic Pressure (mmHg) 88.4 mmHg  LV End Diastolic Diameter (cm) 4.8 cm  LV End Systolic Diameter (cm) 3.6 cm  LV Septum Wall Thickness (cm) 1.1 cm  LV Posterior Wall Thickness (cm) 0.89 cm  Left Atrium Diameter (cm) 4.2 cm  Result Narrative   CARDIOLOGY DEPARTMENT [facility], KLUGH [id number] [facility] Acct #: [id number] [address] [place], [place] [address] Date: 12/14/2016 03:20 PM  Adult Male Age: 83 yrs  ECHOCARDIOGRAM REPORT Outpatient  KC::KCWC STUDY:CHEST WALL TAPE: MD1:FATH, KENNETH ALAN  ECHO:Yes DOPPLER:YesFILE: BP: 118/80 mmHg COLOR:YesCONTRAST:NoMACHINE:PhilipsHeight: 69 in RV BIOPSY:No 3D:No SOUND QLTY:Moderate Weight: 171 lb  MEDIUM:None BSA: 1.9 m2  ___________________________________________________________________________________________  HISTORY:DOE REASON:Assess, LV function INDICATION:R06.02 Shortness of  breath ___________________________________________________________________________________________  ECHOCARDIOGRAPHIC MEASUREMENTS 2D DIMENSIONS AORTA ValuesNormal RangeMAIN PAValuesNormal Range Annulus:nm* [2.3 - 2.9]PA Main:nm* [1.5 - 2.1] Aorta Sin:nm* [3.1 - 3.7] RIGHT VENTRICLE ST Junction:nm* [2.6 - 3.2]RV Base:nm* [ < 4.2] Asc.Aorta:nm* [2.6 - 3.4] RV Mid:nm* [ < 3.5]  LEFT VENTRICLERV Length:nm* [ < 8.6] LVIDd:4.8 cm[4.2 - 5.9] INFERIOR VENA CAVA LVIDs:3.6 cmMax. IVC:nm* [ <= 2.1]  FS:25.5 %[> 25]Min. IVC:nm* SWT:1.1 cm[0.6 - 1.0] ------------------ PWT:0.89 cm [0.6 - 1.0] nm* - not measured  LEFT ATRIUM LA Diam:4.2 cm[3.0 - 4.0] LA A4C Area:nm* [ < 20] LA Volume:nm* Roice.Felt - 58] ___________________________________________________________________________________________  ECHOCARDIOGRAPHIC DESCRIPTIONS  AORTIC ROOT Size:Normal Dissection:INDETERM FOR DISSECTION  AORTIC VALVE Leaflets:TricuspidMorphology:MILDLY THICKENED Mobility:Fully mobile  LEFT VENTRICLE Size:Normal Anterior:Normal  Contraction:NormalLateral:Normal Closest EF:>55% (Estimated) Septal:Normal  LV Masses:No MassesApical:Normal  TDD:UKGU LVH Inferior:Normal  Posterior:Normal Dias.FxClass:N/A  MITRAL VALVE Leaflets:Normal Mobility:Fully  mobile Morphology:Normal  LEFT ATRIUM Size:MILDLY ENLARGED LA Masses:No masses  IA Septum:HYPERLIPOMATOSIS OF IAS mildly thickened  MAIN PA Size:Normal  PULMONIC VALVE Morphology:Normal Mobility:Fully mobile  RIGHT VENTRICLE  RV Masses:No MassesSize:Normal  Free Wall:NormalContraction:Normal  TRICUSPID VALVE Leaflets:Normal Mobility:Fully mobile Morphology:Normal  RIGHT ATRIUM Size:Normal RA Other:None  RA Mass:No masses  PERICARDIUM  Fluid:No effusion  INFERIOR VENACAVA Size:Normal Normal respiratory collapse  ____________________________________________________________________  DOPPLER ECHO and OTHER SPECIAL PROCEDURES  Aortic:TRIVIAL ARNo AS 160.0 cm/sec peak vel 10.2 mmHg peak grad 4.0 mmHg mean grad2.9 cm^2 by DOPPLER   Mitral:MILD MR No MS MV Inflow E Vel=61.7 cm/sec MV Annulus E'Vel=5.9 cm/sec E/E'Ratio=10.5  Tricuspid:MILD TR No TS 268.8 cm/sec peak TR vel34.1 mmHg peak RV pressure  Pulmonary:TRIVIAL PRNo PS    ___________________________________________________________________________________________  INTERPRETATION NORMAL LEFT VENTRICULAR SYSTOLIC FUNCTION WITH MILD LVH NORMAL RIGHT VENTRICULAR SYSTOLIC FUNCTION MILD VALVULAR REGURGITATION (See above) Closest EF: >55% (Estimated) LVH: MILD LVH Aortic: TRIVIAL AR Mitral: MILD MR mildly thickened  ___________________________________________________________________________________________  Electronically signed by: MD Jordan Hawks on 12/15/2016 08:25 AM Performed By: Johnathan Hausen, RDCS, RVT Ordering Physician: Bartholome Bill ___________________________________________________________________________________________  Other Result Information  Interface, Text Results In - 12/15/2016  8:26 AM EDT                    CARDIOLOGY DEPARTMENT                   [place]  Alena Bills                       [facility]                      [id number]                   [facility]                 Acct #: [id number]         [address] [place], [place] [address]       Date: 12/14/2016 03:20 PM                                                            Adult Male Age: 83 yrs                    ECHOCARDIOGRAM REPORT                   Outpatient                                                            KC::KCWC           STUDY:CHEST WALL                       TAPE:     MD1:  FATH, Aloha Gell            ECHO:Yes   DOPPLER:Yes                FILE:     BP: 118/80 mmHg           COLOR:Yes  CONTRAST:No      MACHINE:Philips      Height: 69 in       RV BIOPSY:No         3D:No   SOUND QLTY:Moderate     Weight: 171 lb          MEDIUM:None                                       BSA: 1.9 m2  ___________________________________________________________________________________________                  HISTORY:DOE                   REASON:Assess, LV function               INDICATION:R06.02 Shortness of breath ___________________________________________________________________________________________  ECHOCARDIOGRAPHIC MEASUREMENTS 2D DIMENSIONS AORTA             Values      Normal Range      MAIN PA          Values      Normal Range           Annulus:  nm*       [2.3 - 2.9]  PA Main:  nm*       [1.5 - 2.1]         Aorta Sin:  nm*       [3.1 - 3.7]       RIGHT VENTRICLE       ST Junction:  nm*       [2.6 - 3.2]                RV Base:  nm*       [ < 4.2]         Asc.Aorta:  nm*       [2.6 - 3.4]                 RV Mid:  nm*       [ < 3.5]  LEFT VENTRICLE                                        RV  Length:  nm*       [ < 8.6]             LVIDd:  4.8 cm    [4.2 - 5.9]       INFERIOR VENA CAVA             LVIDs:  3.6 cm                              Max. IVC:  nm*       [ <= 2.1]                FS:  25.5 %    [> 25]                    Min. IVC:  nm*               SWT:  1.1 cm    [0.6 - 1.0]                   ------------------               PWT:  0.89 cm   [0.6 - 1.0]                   nm* - not measured  LEFT ATRIUM           LA Diam:  4.2 cm    [3.0 - 4.0]       LA A4C Area:  nm*       [ < 20]         LA Volume:  nm*       [18 - 58] ___________________________________________________________________________________________  ECHOCARDIOGRAPHIC DESCRIPTIONS  AORTIC ROOT         Size:Normal   Dissection:INDETERM FOR DISSECTION  AORTIC VALVE     Leaflets:Tricuspid                Morphology:MILDLY THICKENED     Mobility:Fully mobile  LEFT VENTRICLE         Size:Normal                     Anterior:Normal  Contraction:Normal                      Lateral:Normal   Closest EF:>55% (Estimated)  Septal:Normal    LV Masses:No Masses                    Apical:Normal          GUY:QIHK LVH                   Inferior:Normal                                        Posterior:Normal Dias.FxClass:N/A  MITRAL VALVE     Leaflets:Normal                     Mobility:Fully mobile   Morphology:Normal  LEFT ATRIUM         Size:MILDLY ENLARGED           LA Masses:No masses    IA Septum:HYPERLIPOMATOSIS OF IAS             mildly thickened  MAIN PA         Size:Normal  PULMONIC VALVE   Morphology:Normal                     Mobility:Fully mobile  RIGHT VENTRICLE    RV Masses:No Masses                      Size:Normal    Free Wall:Normal                  Contraction:Normal  TRICUSPID VALVE     Leaflets:Normal                     Mobility:Fully mobile   Morphology:Normal  RIGHT ATRIUM         Size:Normal                     RA Other:None      RA Mass:No masses  PERICARDIUM         Fluid:No effusion  INFERIOR VENACAVA         Size:Normal Normal respiratory collapse  ____________________________________________________________________  DOPPLER ECHO and OTHER SPECIAL PROCEDURES    Aortic:TRIVIAL AR                    No AS           160.0 cm/sec peak vel         10.2 mmHg peak grad           4.0 mmHg mean grad            2.9 cm^2 by DOPPLER     Mitral:MILD MR                       No MS           MV Inflow E Vel=61.7 cm/sec   MV Annulus E'Vel=5.9 cm/sec           E/E'Ratio=10.5  Tricuspid:MILD TR                       No TS           268.8 cm/sec peak TR vel      34.1 mmHg peak RV pressure  Pulmonary:TRIVIAL PR  No PS    ___________________________________________________________________________________________  INTERPRETATION NORMAL LEFT VENTRICULAR SYSTOLIC FUNCTION   WITH MILD LVH NORMAL RIGHT VENTRICULAR SYSTOLIC FUNCTION MILD VALVULAR REGURGITATION (See above) Closest EF: >55% (Estimated) LVH: MILD LVH Aortic: TRIVIAL AR Mitral: MILD MR mildly thickened  ___________________________________________________________________________________________  Electronically signed by: MD Jordan Hawks on 12/15/2016 08:25 AM             Performed By: Cephus Shelling, RVT       Ordering Physician: Bartholome Bill ___________________________________________________________________________________________  Status Results Details    Appointment on 12/14/2016 [place]")' [email or web address]/">Encounter Summary

## 2017-02-14 NOTE — Pre-Procedure Instructions (Signed)
CLEARED LOW RISK BY DR Ubaldo Glassing 02/13/17 ON CHART

## 2017-02-21 MED ORDER — TRANEXAMIC ACID 1000 MG/10ML IV SOLN
1000.0000 mg | INTRAVENOUS | Status: AC
  Administered 2017-02-22: 1000 mg via INTRAVENOUS
  Filled 2017-02-21: qty 10

## 2017-02-21 MED ORDER — CLINDAMYCIN PHOSPHATE 900 MG/50ML IV SOLN
900.0000 mg | INTRAVENOUS | Status: AC
  Administered 2017-02-22: 900 mg via INTRAVENOUS

## 2017-02-22 ENCOUNTER — Inpatient Hospital Stay: Payer: Medicare Other

## 2017-02-22 ENCOUNTER — Inpatient Hospital Stay: Payer: Medicare Other | Admitting: Certified Registered"

## 2017-02-22 ENCOUNTER — Inpatient Hospital Stay
Admission: RE | Admit: 2017-02-22 | Discharge: 2017-02-24 | DRG: 470 | Disposition: A | Discharge status: Home / Self Care | Payer: Medicare Other | Source: Ambulatory Visit | Attending: Orthopedic Surgery | Admitting: Orthopedic Surgery

## 2017-02-22 ENCOUNTER — Encounter
Admission: RE | Disposition: A | Discharge status: Home / Self Care | Payer: Self-pay | Source: Ambulatory Visit | Attending: Orthopedic Surgery

## 2017-02-22 ENCOUNTER — Encounter: Payer: Self-pay | Admitting: Orthopedic Surgery

## 2017-02-22 DIAGNOSIS — M1612 Unilateral primary osteoarthritis, left hip: Principal | ICD-10-CM | POA: Diagnosis present

## 2017-02-22 DIAGNOSIS — E871 Hypo-osmolality and hyponatremia: Secondary | ICD-10-CM | POA: Diagnosis present

## 2017-02-22 DIAGNOSIS — H9190 Unspecified hearing loss, unspecified ear: Secondary | ICD-10-CM | POA: Diagnosis present

## 2017-02-22 DIAGNOSIS — Z85820 Personal history of malignant melanoma of skin: Secondary | ICD-10-CM

## 2017-02-22 DIAGNOSIS — I10 Essential (primary) hypertension: Secondary | ICD-10-CM | POA: Diagnosis present

## 2017-02-22 DIAGNOSIS — Z8673 Personal history of transient ischemic attack (TIA), and cerebral infarction without residual deficits: Secondary | ICD-10-CM

## 2017-02-22 DIAGNOSIS — Z96649 Presence of unspecified artificial hip joint: Secondary | ICD-10-CM

## 2017-02-22 DIAGNOSIS — E039 Hypothyroidism, unspecified: Secondary | ICD-10-CM | POA: Diagnosis present

## 2017-02-22 DIAGNOSIS — G20 Parkinson's disease: Secondary | ICD-10-CM | POA: Diagnosis present

## 2017-02-22 DIAGNOSIS — G4730 Sleep apnea, unspecified: Secondary | ICD-10-CM | POA: Diagnosis present

## 2017-02-22 DIAGNOSIS — L8990 Pressure ulcer of unspecified site, unspecified stage: Secondary | ICD-10-CM | POA: Insufficient documentation

## 2017-02-22 DIAGNOSIS — I471 Supraventricular tachycardia: Secondary | ICD-10-CM | POA: Diagnosis present

## 2017-02-22 HISTORY — PX: TOTAL HIP ARTHROPLASTY: SHX124

## 2017-02-22 HISTORY — DX: Cerebral infarction, unspecified: I63.9

## 2017-02-22 SURGERY — ARTHROPLASTY, HIP, TOTAL,POSTERIOR APPROACH
Anesthesia: General | Site: Hip | Laterality: Left | Wound class: Clean

## 2017-02-22 MED ORDER — ALUM & MAG HYDROXIDE-SIMETH 200-200-20 MG/5ML PO SUSP: 30.0000 mL | Freq: Every day | ORAL | Status: DC | PRN

## 2017-02-22 MED ORDER — HYDROMORPHONE HCL 1 MG/ML IJ SOLN
INTRAMUSCULAR | Status: AC
  Administered 2017-02-22: 0.25 mg via INTRAVENOUS
  Filled 2017-02-22: qty 1

## 2017-02-22 MED ORDER — ENOXAPARIN SODIUM 30 MG/0.3ML ~~LOC~~ SOLN
30.0000 mg | Freq: Two times a day (BID) | SUBCUTANEOUS | Status: DC
  Administered 2017-02-23 – 2017-02-24 (×3): 30 mg via SUBCUTANEOUS
  Filled 2017-02-22 (×3): qty 0.3

## 2017-02-22 MED ORDER — KETAMINE HCL 50 MG/ML IJ SOLN
INTRAMUSCULAR | Status: DC | PRN
  Administered 2017-02-22: 25 mg via INTRAVENOUS
  Administered 2017-02-22: 25 mg via INTRAMUSCULAR

## 2017-02-22 MED ORDER — ONDANSETRON HCL 4 MG/2ML IJ SOLN
INTRAMUSCULAR | Status: AC
  Filled 2017-02-22: qty 2

## 2017-02-22 MED ORDER — DIPHENHYDRAMINE HCL 12.5 MG/5ML PO ELIX: 12.5000 mg | ORAL_SOLUTION | ORAL | Status: DC | PRN

## 2017-02-22 MED ORDER — ACETAMINOPHEN 10 MG/ML IV SOLN
INTRAVENOUS | Status: AC
  Filled 2017-02-22: qty 100

## 2017-02-22 MED ORDER — LEVOTHYROXINE SODIUM 125 MCG PO TABS
125.0000 ug | ORAL_TABLET | Freq: Every day | ORAL | Status: DC
  Administered 2017-02-23 – 2017-02-24 (×2): 125 ug via ORAL
  Filled 2017-02-22 (×2): qty 1

## 2017-02-22 MED ORDER — FENTANYL CITRATE (PF) 100 MCG/2ML IJ SOLN
INTRAMUSCULAR | Status: AC
  Administered 2017-02-22: 25 ug via INTRAVENOUS
  Filled 2017-02-22: qty 2

## 2017-02-22 MED ORDER — FENTANYL CITRATE (PF) 100 MCG/2ML IJ SOLN
INTRAMUSCULAR | Status: AC
  Filled 2017-02-22: qty 2

## 2017-02-22 MED ORDER — GLYCOPYRROLATE 0.2 MG/ML IJ SOLN
INTRAMUSCULAR | Status: AC
  Filled 2017-02-22: qty 1

## 2017-02-22 MED ORDER — POLYETHYLENE GLYCOL 3350 17 G PO PACK
17.0000 g | PACK | Freq: Every day | ORAL | Status: DC
  Administered 2017-02-22 – 2017-02-23 (×2): 17 g via ORAL
  Filled 2017-02-22 (×2): qty 1

## 2017-02-22 MED ORDER — CHLORHEXIDINE GLUCONATE 4 % EX LIQD: 60.0000 mL | Freq: Once | CUTANEOUS | Status: DC

## 2017-02-22 MED ORDER — HYDROMORPHONE HCL 1 MG/ML IJ SOLN
0.2500 mg | INTRAMUSCULAR | Status: DC | PRN
  Administered 2017-02-22 (×2): 0.25 mg via INTRAVENOUS

## 2017-02-22 MED ORDER — MORPHINE SULFATE (PF) 2 MG/ML IV SOLN: 2.0000 mg | INTRAVENOUS | Status: DC | PRN

## 2017-02-22 MED ORDER — TRANEXAMIC ACID 1000 MG/10ML IV SOLN
1000.0000 mg | Freq: Once | INTRAVENOUS | Status: AC
  Administered 2017-02-22: 1000 mg via INTRAVENOUS
  Filled 2017-02-22: qty 10

## 2017-02-22 MED ORDER — MAGNESIUM HYDROXIDE 400 MG/5ML PO SUSP
30.0000 mL | Freq: Every day | ORAL | Status: DC | PRN
  Filled 2017-02-22 (×2): qty 30

## 2017-02-22 MED ORDER — ALUM & MAG HYDROXIDE-SIMETH 200-200-20 MG/5ML PO SUSP: 30.0000 mL | ORAL | Status: DC | PRN

## 2017-02-22 MED ORDER — DEXAMETHASONE SODIUM PHOSPHATE 10 MG/ML IJ SOLN
INTRAMUSCULAR | Status: DC | PRN
  Administered 2017-02-22: 5 mg via INTRAVENOUS

## 2017-02-22 MED ORDER — GLYCOPYRROLATE 0.2 MG/ML IJ SOLN
INTRAMUSCULAR | Status: DC | PRN
  Administered 2017-02-22: 0.1 mg via INTRAVENOUS

## 2017-02-22 MED ORDER — MAGNESIUM OXIDE 400 (241.3 MG) MG PO TABS
400.0000 mg | ORAL_TABLET | Freq: Every day | ORAL | Status: DC
  Administered 2017-02-22 – 2017-02-24 (×3): 400 mg via ORAL
  Filled 2017-02-22 (×3): qty 1

## 2017-02-22 MED ORDER — EPHEDRINE SULFATE 50 MG/ML IJ SOLN
INTRAMUSCULAR | Status: AC
  Filled 2017-02-22: qty 1

## 2017-02-22 MED ORDER — FAMOTIDINE 20 MG PO TABS
20.0000 mg | ORAL_TABLET | Freq: Once | ORAL | Status: AC
  Administered 2017-02-22: 20 mg via ORAL

## 2017-02-22 MED ORDER — MENTHOL 3 MG MT LOZG
1.0000 | LOZENGE | OROMUCOSAL | Status: DC | PRN
  Administered 2017-02-22: 3 mg via ORAL
  Filled 2017-02-22 (×2): qty 9

## 2017-02-22 MED ORDER — SALINE SPRAY 0.65 % NA SOLN: 2.0000 | NASAL | Status: DC | PRN

## 2017-02-22 MED ORDER — OCUVITE-LUTEIN PO CAPS
1.0000 | ORAL_CAPSULE | Freq: Two times a day (BID) | ORAL | Status: DC
  Administered 2017-02-22 – 2017-02-24 (×4): 1 via ORAL
  Filled 2017-02-22 (×6): qty 1

## 2017-02-22 MED ORDER — PHENYLEPHRINE HCL 10 MG/ML IJ SOLN
INTRAMUSCULAR | Status: AC
  Filled 2017-02-22: qty 1

## 2017-02-22 MED ORDER — SUGAMMADEX SODIUM 200 MG/2ML IV SOLN
INTRAVENOUS | Status: DC | PRN
  Administered 2017-02-22: 160 mg via INTRAVENOUS

## 2017-02-22 MED ORDER — FENTANYL CITRATE (PF) 100 MCG/2ML IJ SOLN
INTRAMUSCULAR | Status: DC | PRN
  Administered 2017-02-22 (×2): 50 ug via INTRAVENOUS

## 2017-02-22 MED ORDER — LIDOCAINE HCL (PF) 2 % IJ SOLN
INTRAMUSCULAR | Status: AC
  Filled 2017-02-22: qty 2

## 2017-02-22 MED ORDER — ROCURONIUM BROMIDE 100 MG/10ML IV SOLN
INTRAVENOUS | Status: DC | PRN
  Administered 2017-02-22: 10 mg via INTRAVENOUS
  Administered 2017-02-22 (×2): 25 mg via INTRAVENOUS

## 2017-02-22 MED ORDER — ACETAMINOPHEN 650 MG RE SUPP: 650.0000 mg | Freq: Four times a day (QID) | RECTAL | Status: DC | PRN

## 2017-02-22 MED ORDER — METOCLOPRAMIDE HCL 10 MG PO TABS
10.0000 mg | ORAL_TABLET | Freq: Three times a day (TID) | ORAL | Status: AC
  Administered 2017-02-22 – 2017-02-24 (×8): 10 mg via ORAL
  Filled 2017-02-22 (×8): qty 1

## 2017-02-22 MED ORDER — DILTIAZEM HCL ER COATED BEADS 180 MG PO CP24
180.0000 mg | ORAL_CAPSULE | Freq: Every day | ORAL | Status: DC
  Administered 2017-02-23 – 2017-02-24 (×2): 180 mg via ORAL
  Filled 2017-02-22 (×3): qty 1

## 2017-02-22 MED ORDER — PROPOFOL 10 MG/ML IV BOLUS
INTRAVENOUS | Status: AC
  Filled 2017-02-22: qty 20

## 2017-02-22 MED ORDER — PHENYLEPHRINE HCL 10 MG/ML IJ SOLN
INTRAMUSCULAR | Status: DC | PRN
  Administered 2017-02-22 (×3): 100 ug via INTRAVENOUS

## 2017-02-22 MED ORDER — POLYVINYL ALCOHOL 1.4 % OP SOLN: 1.0000 [drp] | Freq: Three times a day (TID) | OPHTHALMIC | Status: DC | PRN

## 2017-02-22 MED ORDER — DEXAMETHASONE SODIUM PHOSPHATE 10 MG/ML IJ SOLN
INTRAMUSCULAR | Status: AC
  Filled 2017-02-22: qty 1

## 2017-02-22 MED ORDER — SUGAMMADEX SODIUM 200 MG/2ML IV SOLN
INTRAVENOUS | Status: AC
  Filled 2017-02-22: qty 2

## 2017-02-22 MED ORDER — NEOMYCIN-POLYMYXIN B GU 40-200000 IR SOLN
Status: AC
  Filled 2017-02-22: qty 20

## 2017-02-22 MED ORDER — FERROUS SULFATE 325 (65 FE) MG PO TABS
325.0000 mg | ORAL_TABLET | Freq: Two times a day (BID) | ORAL | Status: DC
  Administered 2017-02-22 – 2017-02-24 (×4): 325 mg via ORAL
  Filled 2017-02-22 (×4): qty 1

## 2017-02-22 MED ORDER — PHENOL 1.4 % MT LIQD: 1.0000 | OROMUCOSAL | Status: DC | PRN

## 2017-02-22 MED ORDER — PANTOPRAZOLE SODIUM 40 MG PO TBEC
40.0000 mg | DELAYED_RELEASE_TABLET | Freq: Two times a day (BID) | ORAL | Status: DC
  Administered 2017-02-22 – 2017-02-24 (×5): 40 mg via ORAL
  Filled 2017-02-22 (×5): qty 1

## 2017-02-22 MED ORDER — SENNOSIDES-DOCUSATE SODIUM 8.6-50 MG PO TABS
1.0000 | ORAL_TABLET | Freq: Two times a day (BID) | ORAL | Status: DC
  Administered 2017-02-22 – 2017-02-24 (×4): 1 via ORAL
  Filled 2017-02-22 (×4): qty 1

## 2017-02-22 MED ORDER — PROPOFOL 500 MG/50ML IV EMUL
INTRAVENOUS | Status: AC
  Filled 2017-02-22: qty 50

## 2017-02-22 MED ORDER — ACETAMINOPHEN 325 MG PO TABS: 650.0000 mg | ORAL_TABLET | Freq: Four times a day (QID) | ORAL | Status: DC | PRN

## 2017-02-22 MED ORDER — ACETAMINOPHEN 10 MG/ML IV SOLN
INTRAVENOUS | Status: DC | PRN
  Administered 2017-02-22: 1000 mg via INTRAVENOUS

## 2017-02-22 MED ORDER — ONDANSETRON HCL 4 MG PO TABS: 4.0000 mg | ORAL_TABLET | Freq: Four times a day (QID) | ORAL | Status: DC | PRN

## 2017-02-22 MED ORDER — ONDANSETRON HCL 4 MG/2ML IJ SOLN
4.0000 mg | Freq: Four times a day (QID) | INTRAMUSCULAR | Status: DC | PRN
  Administered 2017-02-22: 4 mg via INTRAVENOUS
  Filled 2017-02-22: qty 2

## 2017-02-22 MED ORDER — ACETAMINOPHEN 10 MG/ML IV SOLN
1000.0000 mg | Freq: Four times a day (QID) | INTRAVENOUS | Status: AC
  Administered 2017-02-22 – 2017-02-23 (×4): 1000 mg via INTRAVENOUS
  Filled 2017-02-22 (×4): qty 100

## 2017-02-22 MED ORDER — TRAMADOL HCL 50 MG PO TABS
50.0000 mg | ORAL_TABLET | ORAL | Status: DC | PRN
  Administered 2017-02-23 (×2): 50 mg via ORAL
  Administered 2017-02-23 – 2017-02-24 (×3): 100 mg via ORAL
  Filled 2017-02-22 (×3): qty 2
  Filled 2017-02-22 (×2): qty 1

## 2017-02-22 MED ORDER — FENTANYL CITRATE (PF) 100 MCG/2ML IJ SOLN
25.0000 ug | INTRAMUSCULAR | Status: AC | PRN
  Administered 2017-02-22 (×6): 25 ug via INTRAVENOUS

## 2017-02-22 MED ORDER — BUPIVACAINE HCL (PF) 0.5 % IJ SOLN
INTRAMUSCULAR | Status: AC
  Filled 2017-02-22: qty 10

## 2017-02-22 MED ORDER — OXYCODONE HCL 5 MG PO TABS
5.0000 mg | ORAL_TABLET | ORAL | Status: DC | PRN
  Administered 2017-02-22: 5 mg via ORAL
  Filled 2017-02-22: qty 1

## 2017-02-22 MED ORDER — ROCURONIUM BROMIDE 50 MG/5ML IV SOLN
INTRAVENOUS | Status: AC
  Filled 2017-02-22: qty 1

## 2017-02-22 MED ORDER — SODIUM CHLORIDE 0.9 % IV SOLN
INTRAVENOUS | Status: DC
  Administered 2017-02-22 – 2017-02-23 (×2): via INTRAVENOUS

## 2017-02-22 MED ORDER — RISAQUAD PO CAPS
1.0000 | ORAL_CAPSULE | Freq: Every day | ORAL | Status: DC
  Administered 2017-02-23 – 2017-02-24 (×2): 1 via ORAL
  Filled 2017-02-22 (×3): qty 1

## 2017-02-22 MED ORDER — KETAMINE HCL 50 MG/ML IJ SOLN
INTRAMUSCULAR | Status: AC
  Filled 2017-02-22: qty 10

## 2017-02-22 MED ORDER — ONDANSETRON HCL 4 MG/2ML IJ SOLN: 4.0000 mg | Freq: Once | INTRAMUSCULAR | Status: DC | PRN

## 2017-02-22 MED ORDER — FLEET ENEMA 7-19 GM/118ML RE ENEM: 1.0000 | ENEMA | Freq: Once | RECTAL | Status: DC | PRN

## 2017-02-22 MED ORDER — PROPOFOL 10 MG/ML IV BOLUS
INTRAVENOUS | Status: DC | PRN
  Administered 2017-02-22: 100 mg via INTRAVENOUS

## 2017-02-22 MED ORDER — SUCCINYLCHOLINE CHLORIDE 20 MG/ML IJ SOLN
INTRAMUSCULAR | Status: AC
  Filled 2017-02-22: qty 1

## 2017-02-22 MED ORDER — VITAMIN D 1000 UNITS PO TABS
1000.0000 [IU] | ORAL_TABLET | Freq: Two times a day (BID) | ORAL | Status: DC
  Administered 2017-02-22 – 2017-02-24 (×4): 1000 [IU] via ORAL
  Filled 2017-02-22 (×4): qty 1

## 2017-02-22 MED ORDER — NEOMYCIN-POLYMYXIN B GU 40-200000 IR SOLN
Status: DC | PRN
  Administered 2017-02-22: 16 mL

## 2017-02-22 MED ORDER — ONDANSETRON HCL 4 MG/2ML IJ SOLN
INTRAMUSCULAR | Status: DC | PRN
  Administered 2017-02-22: 4 mg via INTRAVENOUS

## 2017-02-22 MED ORDER — BISACODYL 10 MG RE SUPP
10.0000 mg | Freq: Every day | RECTAL | Status: DC | PRN
  Administered 2017-02-24: 10 mg via RECTAL
  Filled 2017-02-22: qty 1

## 2017-02-22 MED ORDER — SUCCINYLCHOLINE CHLORIDE 20 MG/ML IJ SOLN
INTRAMUSCULAR | Status: DC | PRN
  Administered 2017-02-22: 100 mg via INTRAVENOUS

## 2017-02-22 MED ORDER — CLINDAMYCIN PHOSPHATE 900 MG/50ML IV SOLN
INTRAVENOUS | Status: AC
  Filled 2017-02-22: qty 50

## 2017-02-22 MED ORDER — FAMOTIDINE 20 MG PO TABS
ORAL_TABLET | ORAL | Status: AC
  Administered 2017-02-22: 20 mg via ORAL
  Filled 2017-02-22: qty 1

## 2017-02-22 MED ORDER — CARBIDOPA-LEVODOPA 25-100 MG PO TABS
1.0000 | ORAL_TABLET | Freq: Three times a day (TID) | ORAL | Status: DC
  Administered 2017-02-22 – 2017-02-24 (×7): 1 via ORAL
  Filled 2017-02-22 (×10): qty 1

## 2017-02-22 MED ORDER — CLINDAMYCIN PHOSPHATE 600 MG/50ML IV SOLN
600.0000 mg | Freq: Four times a day (QID) | INTRAVENOUS | Status: AC
  Administered 2017-02-22 – 2017-02-23 (×4): 600 mg via INTRAVENOUS
  Filled 2017-02-22 (×5): qty 50

## 2017-02-22 MED ORDER — LACTATED RINGERS IV SOLN
INTRAVENOUS | Status: DC
  Administered 2017-02-22 (×2): via INTRAVENOUS

## 2017-02-22 SURGICAL SUPPLY — 57 items
BLADE CLIPPER SURG (BLADE) ×3 IMPLANT
BLADE DRUM FLTD (BLADE) ×3 IMPLANT
BLADE SAW 1 (BLADE) ×3 IMPLANT
CANISTER SUCT 1200ML W/VALVE (MISCELLANEOUS) ×3 IMPLANT
CANISTER SUCT 3000ML PPV (MISCELLANEOUS) ×6 IMPLANT
CAPT HIP TOTAL 2 ×3 IMPLANT
CARTRIDGE OIL MAESTRO DRILL (MISCELLANEOUS) ×1 IMPLANT
CATH FOL LEG HOLDER (MISCELLANEOUS) ×3 IMPLANT
CATH TRAY METER 16FR LF (MISCELLANEOUS) ×3 IMPLANT
DIFFUSER MAESTRO (MISCELLANEOUS) ×3 IMPLANT
DRAPE INCISE IOBAN 66X60 STRL (DRAPES) ×3 IMPLANT
DRAPE SHEET LG 3/4 BI-LAMINATE (DRAPES) ×3 IMPLANT
DRESSING ALLEVYN LIFE SACRUM (GAUZE/BANDAGES/DRESSINGS) ×3 IMPLANT
DRSG DERMACEA 8X12 NADH (GAUZE/BANDAGES/DRESSINGS) ×3 IMPLANT
DRSG OPSITE POSTOP 4X12 (GAUZE/BANDAGES/DRESSINGS) IMPLANT
DRSG OPSITE POSTOP 4X14 (GAUZE/BANDAGES/DRESSINGS) ×3 IMPLANT
DRSG TEGADERM 4X4.75 (GAUZE/BANDAGES/DRESSINGS) ×3 IMPLANT
DURAPREP 26ML APPLICATOR (WOUND CARE) ×3 IMPLANT
ELECT BLADE 6.5 EXT (BLADE) ×3 IMPLANT
ELECT CAUTERY BLADE 6.4 (BLADE) ×3 IMPLANT
EVACUATOR 1/8 PVC DRAIN (DRAIN) ×3 IMPLANT
GLOVE BIO SURGEON STRL SZ 6.5 (GLOVE) ×4 IMPLANT
GLOVE BIO SURGEONS STRL SZ 6.5 (GLOVE) ×2
GLOVE BIOGEL M STRL SZ7.5 (GLOVE) ×6 IMPLANT
GLOVE BIOGEL PI IND STRL 9 (GLOVE) ×1 IMPLANT
GLOVE BIOGEL PI INDICATOR 9 (GLOVE) ×2
GLOVE INDICATOR 7.0 STRL GRN (GLOVE) ×6 IMPLANT
GLOVE INDICATOR 8.0 STRL GRN (GLOVE) ×3 IMPLANT
GLOVE SURG SYN 9.0  PF PI (GLOVE) ×2
GLOVE SURG SYN 9.0 PF PI (GLOVE) ×1 IMPLANT
GOWN STRL REUS W/ TWL LRG LVL3 (GOWN DISPOSABLE) ×3 IMPLANT
GOWN STRL REUS W/TWL 2XL LVL3 (GOWN DISPOSABLE) ×3 IMPLANT
GOWN STRL REUS W/TWL LRG LVL3 (GOWN DISPOSABLE) ×6
HOOD PEEL AWAY FLYTE STAYCOOL (MISCELLANEOUS) ×6 IMPLANT
KIT RM TURNOVER STRD PROC AR (KITS) ×3 IMPLANT
NDL SAFETY 18GX1.5 (NEEDLE) ×3 IMPLANT
NS IRRIG 500ML POUR BTL (IV SOLUTION) ×3 IMPLANT
OIL CARTRIDGE MAESTRO DRILL (MISCELLANEOUS) ×3
PACK HIP PROSTHESIS (MISCELLANEOUS) ×3 IMPLANT
PIN STEIN THRED 5/32 (Pin) ×3 IMPLANT
PULSAVAC PLUS IRRIG FAN TIP (DISPOSABLE) ×3
SOL .9 NS 3000ML IRR  AL (IV SOLUTION) ×2
SOL .9 NS 3000ML IRR UROMATIC (IV SOLUTION) ×1 IMPLANT
SOL PREP PVP 2OZ (MISCELLANEOUS) ×3
SOLUTION PREP PVP 2OZ (MISCELLANEOUS) ×1 IMPLANT
SPONGE DRAIN TRACH 4X4 STRL 2S (GAUZE/BANDAGES/DRESSINGS) ×3 IMPLANT
STAPLER SKIN PROX 35W (STAPLE) ×3 IMPLANT
SUT ETHIBOND #5 BRAIDED 30INL (SUTURE) ×3 IMPLANT
SUT VIC AB 0 CT1 36 (SUTURE) ×3 IMPLANT
SUT VIC AB 1 CT1 36 (SUTURE) ×6 IMPLANT
SUT VIC AB 2-0 CT1 27 (SUTURE) ×2
SUT VIC AB 2-0 CT1 TAPERPNT 27 (SUTURE) ×1 IMPLANT
SYR 20CC LL (SYRINGE) ×3 IMPLANT
TAPE ADH 3 LX (MISCELLANEOUS) ×3 IMPLANT
TAPE TRANSPORE STRL 2 31045 (GAUZE/BANDAGES/DRESSINGS) ×3 IMPLANT
TIP FAN IRRIG PULSAVAC PLUS (DISPOSABLE) ×1 IMPLANT
TOWEL OR 17X26 4PK STRL BLUE (TOWEL DISPOSABLE) ×3 IMPLANT

## 2017-02-22 NOTE — Evaluation (Signed)
Physical Therapy Evaluation Patient Details Name: Duane Vasquez. MRN: [id number] DOB: 03/21/1926 Today's Date: 02/22/2017   History of Present Illness  Ptis a 83 yo male with dgenerative arthrosis of the L hip and is s/p L THA.  PMH includes: arthritis, prostate CA, SVT, HOH, HTN, hypothyroidism, melanoma, Parkinson's disease, and TIA.    Clinical Impression  Pt presents with deficits in strength, transfers, mobility, gait, balance, and activity tolerance.  Pt required Min A with bed mobility tasks with cues for proper sequencing.  Pt required CGA with sit to/from stand transfers from elevated EOB with mod verbal cues for sequencing for hip precaution compliance.  Pt able to amb 5' with CGA and RW with step-to gait pattern and short step length.  Post hip precaution education provided to both pt and pt's daughter.  Pt will benefit from PT services in a SNF setting upon discharge to safely address above deficits for decreased caregiver assistance and eventual return to PLOF.      Follow Up Recommendations SNF    Equipment Recommendations  None recommended by PT    Recommendations for Other Services       Precautions / Restrictions Precautions Precautions: Posterior Hip Precaution Booklet Issued: Yes (comment) Precaution Comments: Posterior hip precaution education provided to pt and pt's daughter Restrictions Weight Bearing Restrictions: Yes LLE Weight Bearing: Weight bearing as tolerated      Mobility  Bed Mobility Overal bed mobility: Needs Assistance Bed Mobility: Supine to Sit;Sit to Supine     Supine to sit: Min assist Sit to supine: Min assist   General bed mobility comments: Min A for LLE positioning and in/out of bed  Transfers Overall transfer level: Needs assistance Equipment used: Rolling walker (2 wheeled) Transfers: Sit to/from Stand Sit to Stand: Min guard         General transfer comment: Mod verbal cues for sequencing during sit to/from stand  from elevated EOB to maintain compliance with posterior hip precautions  Ambulation/Gait Ambulation/Gait assistance: Min guard Ambulation Distance (Feet): 5 Feet Assistive device: Rolling walker (2 wheeled) Gait Pattern/deviations: Step-to pattern;Decreased step length - right;Decreased stance time - left   Gait velocity interpretation: Below normal speed for age/gender General Gait Details: Mod verbal cues for sequencing and for general safety with RW during ambulation  Stairs Stairs:  (Deferred)          Wheelchair Mobility    Modified Rankin (Stroke Patients Only)       Balance Overall balance assessment: Needs assistance Sitting-balance support: Feet unsupported;Feet supported;No upper extremity supported;Bilateral upper extremity supported Sitting balance-Leahy Scale: Good     Standing balance support: Bilateral upper extremity supported Standing balance-Leahy Scale: Fair                               Pertinent Vitals/Pain Pain Assessment: 0-10 Pain Score: 6  Pain Location: L hip Pain Descriptors / Indicators: Operative site guarding;Sore Pain Intervention(s): Premedicated before session;Monitored during session;Limited activity within patient's tolerance    Home Living Family/patient expects to be discharged to:: Private residence Living Arrangements: Spouse/significant other Available Help at Discharge: Family;Available 24 hours/day;Available PRN/intermittently;Other (Comment) (Spouse available 24 hr/day but may be somewhat limited in ability to provide assistance, family available intermittently) Type of Home: House Home Access: Stairs to enter   [employer] of Steps: 1 stair with grab bar then a landing followed by a small threshold without a rail Home Layout: One level Home  Equipment: Gilford Rile - 2 wheels;Other (comment);Walker - 4 wheels (Parkinson's walker)      Prior Function Level of Independence: Independent with assistive  device(s)         Comments: Mod Ind with amb limited community distances with combination of RW, rollator, and Parkinson's walker, one recent LOB but no fall to floor     Hand Dominance   Dominant Hand: Right    Extremity/Trunk Assessment   Upper Extremity Assessment Upper Extremity Assessment: Overall WFL for tasks assessed    Lower Extremity Assessment Lower Extremity Assessment: Generalized weakness;LLE deficits/detail LLE Deficits / Details: Sensation to light touch and proprioception grossly intact to BLEs LLE: Unable to fully assess due to pain       Communication   Communication: No difficulties  Cognition Arousal/Alertness: Awake/alert Behavior During Therapy: WFL for tasks assessed/performed Overall Cognitive Status: Within Functional Limits for tasks assessed                                        General Comments      Exercises Total Joint Exercises Ankle Circles/Pumps: AROM;Both;5 reps;10 reps Quad Sets: Strengthening;Both;5 reps;10 reps Gluteal Sets: Strengthening;Both;5 reps;10 reps Hip ABduction/ADduction: AAROM;Both;5 reps Long Arc Quad: AROM;Both;5 reps;10 reps Knee Flexion: AROM;Both;5 reps;10 reps Marching in Standing: AROM;Both;5 reps Other Exercises Other Exercises: HEP education/review for BLE APs, GS, and QS per handout   Assessment/Plan    PT Assessment Patient needs continued PT services  PT Problem List Decreased strength;Decreased activity tolerance;Decreased balance;Decreased knowledge of use of DME;Decreased mobility       PT Treatment Interventions DME instruction;Gait training;Stair training;Functional mobility training;Balance training;Neuromuscular re-education;Therapeutic exercise;Therapeutic activities;Patient/family education    PT Goals (Current goals can be found in the Care Plan section)  Acute Rehab PT Goals Patient Stated Goal: To walk better and return home PT Goal Formulation: With patient Time  For Goal Achievement: 03/07/17 Potential to Achieve Goals: Good    Frequency BID   Barriers to discharge Inaccessible home environment;Decreased caregiver support      Co-evaluation               AM-PAC PT "6 Clicks" Daily Activity  Outcome Measure Difficulty turning over in bed (including adjusting bedclothes, sheets and blankets)?: Unable Difficulty moving from lying on back to sitting on the side of the bed? : Unable Difficulty sitting down on and standing up from a chair with arms (e.g., wheelchair, bedside commode, etc,.)?: Unable Help needed moving to and from a bed to chair (including a wheelchair)?: A Little Help needed walking in hospital room?: A Lot Help needed climbing 3-5 steps with a railing? : A Lot 6 Click Score: 10    End of Session Equipment Utilized During Treatment: Gait belt;Oxygen Activity Tolerance: Patient limited by fatigue Patient left: in bed;with bed alarm set;with SCD's reapplied;with call bell/phone within reach;with family/visitor present Nurse Communication: Mobility status PT Visit Diagnosis: Other abnormalities of gait and mobility (R26.89);Muscle weakness (generalized) (M62.81)    Time: 2706-2376 PT Time Calculation (min) (ACUTE ONLY): 45 min   Charges:   PT Evaluation $PT Eval Low Complexity: 1 Low PT Treatments $Therapeutic Exercise: 8-22 mins   PT G Codes:   PT G-Codes **NOT FOR INPATIENT CLASS** Functional Assessment Tool Used: AM-PAC 6 Clicks Basic Mobility Functional Limitation: Mobility: Walking and moving around Mobility: Walking and Moving Around Current Status (E8315): At least 60 percent but less than 80  percent impaired, limited or restricted Mobility: Walking and Moving Around Goal Status [phone]): At least 1 percent but less than 20 percent impaired, limited or restricted    D. Royetta Asal PT, DPT 02/22/17, 4:42 PM

## 2017-02-22 NOTE — Op Note (Signed)
OPERATIVE NOTE  DATE OF SURGERY:  02/22/2017  PATIENT NAME:  Duane Vasquez.   DOB: [birth year]  MRN: [id number]  PRE-OPERATIVE DIAGNOSIS: Degenerative arthrosis of the left hip, primary  POST-OPERATIVE DIAGNOSIS:  Same  PROCEDURE:  Left total hip arthroplasty  SURGEON:  Marciano Sequin. M.D.  ASSISTANT:  Vance Peper, PA (present and scrubbed throughout the case, critical for assistance with exposure, retraction, instrumentation, and closure)  ANESTHESIA: general  ESTIMATED BLOOD LOSS: 175 mL  FLUIDS REPLACED: 1300 mL of crystalloid  DRAINS: 2 medium drains to a Hemovac reservoir  IMPLANTS UTILIZED: DePuy 15 mm small stature AML femoral stem, 58 mm OD Pinnacle 100 acetabular component, +4 mm 10 Pinnacle Marathon polyethylene insert, and a 36 mm M-SPEC +1.5 mm hip ball  INDICATIONS FOR SURGERY: Duane Vasquez. is a 83 y.o. year old male with a long history of progressive hip and groin  pain. X-rays demonstrated severe degenerative changes. The patient had not seen any significant improvement despite conservative nonsurgical intervention. After discussion of the risks and benefits of surgical intervention, the patient expressed understanding of the risks benefits and agree with plans for total hip arthroplasty.   The risks, benefits, and alternatives were discussed at length including but not limited to the risks of infection, bleeding, nerve injury, stiffness, blood clots, the need for revision surgery, limb length inequality, dislocation, cardiopulmonary complications, among others, and they were willing to proceed.  PROCEDURE IN DETAIL: The patient was brought into the operating room and, after adequate general anesthesia was achieved, the patient was placed in a right lateral decubitus position. Axillary roll was placed and all bony prominences were well-padded. The patient's left hip was cleaned and prepped with alcohol and DuraPrep and draped in the usual sterile fashion. A  "timeout" was performed as per usual protocol. A lateral curvilinear incision was made gently curving towards the posterior superior iliac spine. The IT band was incised in line with the skin incision and the fibers of the gluteus maximus were split in line. The piriformis tendon was identified, skeletonized, and incised at its insertion to the proximal femur and reflected posteriorly. A T type posterior capsulotomy was performed. Prior to dislocation of the femoral head, a threaded Steinmann pin was inserted through a separate stab incision into the pelvis superior to the acetabulum and bent in the form of a stylus so as to assess limb length and hip offset throughout the procedure. The femoral head was then dislocated posteriorly. Inspection of the femoral head demonstrated severe degenerative changes with full-thickness loss of articular cartilage. The femoral neck cut was performed using an oscillating saw. The anterior capsule was elevated off of the femoral neck using a periosteal elevator. Attention was then directed to the acetabulum. The remnant of the labrum was excised using electrocautery. Inspection of the acetabulum also demonstrated significant degenerative changes. The acetabulum was reamed in sequential fashion up to a 57 mm diameter. Good punctate bleeding bone was encountered. A 58 mm Pinnacle 100 acetabular component was positioned and impacted into place. Good scratch fit was appreciated. A +4 mm neutral polyethylene trial was inserted.  Attention was then directed to the proximal femur. A hole for reaming of the proximal femoral canal was created using a high-speed burr. The femoral canal was reamed in sequential fashion up to a 14.5 mm diameter. This allowed for approximately 6 cm of scratch fit. It was thus elected to ream up to a 15 mm diameter to allow for a line  to line fit. Serial broaches were inserted up to a 15 mm small stature femoral broach. Calcar region was planed and a trial  reduction was performed using a 36 mm hip ball with a +1.5 mm neck length. Reasonably good stability was appreciated, but it was elected to trial with a +4 mm 10 liner with the high side directed at the 4:00 position. Good equalization of limb lengths and hip offset was appreciated and excellent stability was noted both anteriorly and posteriorly. Trial components were removed. The acetabular shell was irrigated with copious amounts of normal saline with antibiotic solution and suctioned dry. A +4 mm 10 Pinnacle Marathon polyethylene insert was positioned with the high side at the 4:00 position and impacted into place. Next, a 15 mm small stature AML femoral stem was positioned and impacted into place. Excellent scratch fit was appreciated. A trial reduction was again performed with a 36 mm hip ball with a +1.5 mm neck length. Again, good equalization of limb lengths was appreciated and excellent stability appreciated both anteriorly and posteriorly. The hip was then dislocated and the trial hip ball was removed. The Morse taper was cleaned and dried. A 36 mm M-SPEC hip ball with a +1.5 mm neck length was placed on the trunnion and impacted into place. The hip was then reduced and placed through range of motion. Excellent stability was appreciated both anteriorly and posteriorly.  The wound was irrigated with copious amounts of normal saline with antibiotic solution and suctioned dry. Good hemostasis was appreciated. The posterior capsulotomy was repaired using #5 Ethibond. Piriformis tendon was reapproximated to the undersurface of the gluteus medius tendon using #5 Ethibond. Two medium drains were placed in the wound bed and brought out through separate stab incisions to be attached to a Hemovac reservoir. The IT band was reapproximated using interrupted sutures of #1 Vicryl. Subcutaneous tissue was approximated using first #0 Vicryl followed by #2-0 Vicryl. The skin was closed with skin staples.  The  patient tolerated the procedure well and was transported to the recovery room in stable condition.   Marciano Sequin., M.D.

## 2017-02-22 NOTE — Anesthesia Procedure Notes (Signed)
Procedure Name: Intubation Performed by: Rolla Plate Pre-anesthesia Checklist: Patient identified, Patient being monitored, Timeout performed, Emergency Drugs available and Suction available Patient Re-evaluated:Patient Re-evaluated prior to induction Oxygen Delivery Method: Circle system utilized Preoxygenation: Pre-oxygenation with 100% oxygen Induction Type: IV induction, Rapid sequence and Cricoid Pressure applied Laryngoscope Size: Miller and 2 Grade View: Grade II Tube type: Oral Tube size: 7.5 mm Number of attempts: 1 Airway Equipment and Method: Stylet Placement Confirmation: ETT inserted through vocal cords under direct vision,  positive ETCO2 and breath sounds checked- equal and bilateral Secured at: 22 cm Tube secured with: Tape Dental Injury: Teeth and Oropharynx as per pre-operative assessment

## 2017-02-22 NOTE — Anesthesia Preprocedure Evaluation (Signed)
Anesthesia Evaluation  Patient identified by MRN, date of birth, ID band Patient awake    Reviewed: Allergy & Precautions, H&P , NPO status , Patient's Chart, lab work & pertinent test results, reviewed documented beta blocker date and time   History of Anesthesia Complications (+) PONV and history of anesthetic complications  Airway Mallampati: III  TM Distance: >3 FB Neck ROM: full    Dental  (+) Poor Dentition, Teeth Intact, Chipped   Pulmonary neg pulmonary ROS, shortness of breath, sleep apnea and Continuous Positive Airway Pressure Ventilation , former smoker,    Pulmonary exam normal        Cardiovascular Exercise Tolerance: Good hypertension, On Medications + Peripheral Vascular Disease  negative cardio ROS Normal cardiovascular examSupra Ventricular Tachycardia  Rhythm:regular Rate:Normal  Clearance per [place].     Neuro/Psych TIA Neuromuscular disease CVA, No Residual Symptoms negative neurological ROS  negative psych ROS   GI/Hepatic negative GI ROS, Neg liver ROS,   Endo/Other  negative endocrine ROSHypothyroidism   Renal/GU negative Renal ROS  negative genitourinary   Musculoskeletal   Abdominal   Peds  Hematology negative hematology ROS (+)   Anesthesia Other Findings Past Medical History: No date: Arthritis No date: Cancer ([place])     Comment:  prostate No date: Dyspnea No date: Dysrhythmia     Comment:  svt No date: HOH (hard of hearing) No date: Hypertension No date: Hypothyroidism No date: Melanoma ([place]) No date: Parkinson disease ([place]) No date: PONV (postoperative nausea and vomiting) No date: Sleep apnea     Comment:  uses cpap No date: Stroke [facility])     Comment:  tia many many years ago. No date: SVT (supraventricular tachycardia) ([facility]) No date: TIA (transient ischemic attack) Past Surgical History: No date: APPENDECTOMY No date: CATARACT EXTRACTION, BILATERAL No date: HERNIA  REPAIR     Comment:  inguinal 05/2016: JOINT REPLACEMENT; Right     Comment:  hip No date: KNEE ARTHROSCOPY No date: prostate seeding No date: THYROID SURGERY 06/27/2016: TOTAL HIP ARTHROPLASTY; Right     Comment:  Procedure: TOTAL HIP ARTHROPLASTY;  Surgeon: Dereck Leep, MD;  Location: [facility] ORS;  Service: Orthopedics;                Laterality: Right; No date: TRANSURETHRAL RESECTION OF BLADDER NECK No date: TRANSURETHRAL RESECTION OF PROSTATE BMI    Body Mass Index:  25.10 kg/m     Reproductive/Obstetrics negative OB ROS                             Anesthesia Physical Anesthesia Plan  ASA: III  Anesthesia Plan: General   Post-op Pain Management:    Induction:   PONV Risk Score and Plan: 4 or greater and Ondansetron, Dexamethasone, Midazolam and Propofol infusion  Airway Management Planned:   Additional Equipment:   Intra-op Plan:   Post-operative Plan:   Informed Consent: I have reviewed the patients History and Physical, chart, labs and discussed the procedure including the risks, benefits and alternatives for the proposed anesthesia with the patient or authorized representative who has indicated his/her understanding and acceptance.   Dental Advisory Given  Plan Discussed with: CRNA  Anesthesia Plan Comments:         Anesthesia Quick Evaluation

## 2017-02-22 NOTE — Anesthesia Post-op Follow-up Note (Signed)
Anesthesia QCDR form completed.        

## 2017-02-22 NOTE — H&P (Signed)
The patient has been re-examined, and the chart reviewed, and there have been no interval changes to the documented history and physical.    The risks, benefits, and alternatives have been discussed at length. The patient expressed understanding of the risks benefits and agreed with plans for surgical intervention.  Statia Burdick P. Ben Habermann, Jr. M.D.    

## 2017-02-22 NOTE — Transfer of Care (Signed)
Immediate Anesthesia Transfer of Care Note  Patient: Duane Vasquez.  Procedure(s) Performed: Procedure(s): TOTAL HIP ARTHROPLASTY (Left)  Patient Location: PACU  Anesthesia Type:General  Level of Consciousness: awake  Airway & Oxygen Therapy: Patient Spontanous Breathing and Patient connected to face mask oxygen  Post-op Assessment: Report given to RN and Post -op Vital signs reviewed and stable  Post vital signs: Reviewed  Last Vitals:  Vitals:   02/22/17 0620 02/22/17 1052  BP: (!) 180/87 117/68  Pulse: 68 (!) 52  Resp: 15 16  Temp: 36.7 C   SpO2: 92% 100%    Last Pain:  Vitals:   02/22/17 0620  TempSrc: Oral  PainSc: 2       Patients Stated Pain Goal: 0 (02/33/43 5686)  Complications: No apparent anesthesia complications

## 2017-02-22 NOTE — NC FL2 (Signed)
[place] LEVEL OF CARE SCREENING TOOL     IDENTIFICATION  Patient Name: Duane Vasquez. Birthdate: [birth year] Sex: male Admission Date (Current Location): 02/22/2017  [place] and [place] Number:  Engineering geologist and Address:  [facility], [address], [place], [place] [address]      Provider Number: [id number]  Attending Physician Name and Address:  Dereck Leep, MD  Relative Name and Phone Number:       Current Level of Care: Hospital Recommended Level of Care: [place] Prior Approval Number:    Date Approved/Denied:   PASRR Number:  ([id number] )  Discharge Plan: SNF    Current Diagnoses: Patient Active Problem List   Diagnosis Date Noted  . Status post total replacement of hip 02/22/2017  . S/P total hip arthroplasty 06/27/2016  . Lumbar radiculitis 07/14/2015  . DDD (degenerative disc disease), lumbar 07/14/2015  . Parkinson's disease ([place]) 05/20/2015  . CSR (central serous retinopathy), left 10/18/2014  . Benign meningioma ([place]) 10/18/2014  . Vitamin D deficiency 10/02/2014  . Carotid artery stenosis 10/02/2014  . Paroxysmal SVT (supraventricular tachycardia) ([place]) 07/30/2014  . Hypothyroidism due to acquired atrophy of thyroid 04/01/2014  . Hypertension 02/12/2014  . Sleep apnea 11/13/2013  . Hyperlipidemia, unspecified 11/13/2013  . Hereditary and idiopathic peripheral neuropathy 09/06/2013  . Inguinal hernia 09/06/2013  . Increased frequency of urination 03/21/2012  . Personal history of prostate cancer 03/21/2012  . Hemorrhoids 03/21/2012    Orientation RESPIRATION BLADDER Height & Weight     Self, Time, Situation, Place  O2 (1 Liter) Continent Weight: 170 lb (77.1 kg) Height:  5\' 9"  (175.3 cm)  BEHAVIORAL SYMPTOMS/MOOD NEUROLOGICAL BOWEL NUTRITION STATUS      Continent Diet (Diet: Clear Liquid to be Advanced. )  AMBULATORY STATUS COMMUNICATION OF NEEDS Skin   Extensive Assist  Verbally Surgical wounds (Incision: Left Hip. )                       Personal Care Assistance Level of Assistance  Bathing, Feeding, Dressing Bathing Assistance: Limited assistance Feeding assistance: Independent Dressing Assistance: Limited assistance     Functional Limitations Info  Sight, Hearing, Speech Sight Info: Adequate Hearing Info: Adequate Speech Info: Adequate    SPECIAL CARE FACTORS FREQUENCY  PT (By licensed PT), OT (By licensed OT)     PT Frequency:  (5) OT Frequency:  (5)            Contractures      Additional Factors Info  Code Status, Allergies Code Status Info:  (Full Code. ) Allergies Info:  (Amoxicillin, Ceftin Cefuroxime Axetil)           Current Medications (02/22/2017):  This is the current hospital active medication list Current Facility-Administered Medications  Medication Dose Route Frequency Provider Last Rate Last Dose  . 0.9 %  sodium chloride infusion   Intravenous Continuous Hooten, Laurice Record, MD 100 mL/hr at 02/22/17 1250    . acetaminophen (OFIRMEV) IV 1,000 mg  1,000 mg Intravenous Q6H Hooten, Laurice Record, MD   Stopped at 02/22/17 1515  . acetaminophen (TYLENOL) tablet 650 mg  650 mg Oral Q6H PRN Hooten, Laurice Record, MD       Or  . acetaminophen (TYLENOL) suppository 650 mg  650 mg Rectal Q6H PRN Hooten, Laurice Record, MD      . acidophilus (RISAQUAD) capsule 1 capsule  1 capsule Oral Q lunch Hooten, Laurice Record, MD      .  alum & mag hydroxide-simeth (MAALOX/MYLANTA) 200-200-20 MG/5ML suspension 30 mL  30 mL Oral Daily PRN Hooten, Laurice Record, MD      . alum & mag hydroxide-simeth (MAALOX/MYLANTA) 200-200-20 MG/5ML suspension 30 mL  30 mL Oral Q4H PRN Hooten, Laurice Record, MD      . bisacodyl (DULCOLAX) suppository 10 mg  10 mg Rectal Daily PRN Hooten, Laurice Record, MD      . carbidopa-levodopa (SINEMET IR) 25-100 MG per tablet immediate release 1 tablet  1 tablet Oral TID Hooten, Laurice Record, MD      . cholecalciferol (VITAMIN D) tablet 1,000 Units  1,000 Units  Oral BID Hooten, Laurice Record, MD      . clindamycin (CLEOCIN) IVPB 600 mg  600 mg Intravenous Q6H Hooten, Laurice Record, MD 100 mL/hr at 02/22/17 1430 600 mg at 02/22/17 1430  . [START ON 02/23/2017] diltiazem (CARDIZEM CD) 24 hr capsule 180 mg  180 mg Oral Daily Hooten, Laurice Record, MD      . diphenhydrAMINE (BENADRYL) 12.5 MG/5ML elixir 12.5-25 mg  12.5-25 mg Oral Q4H PRN Hooten, Laurice Record, MD      . Derrill Memo ON 02/23/2017] enoxaparin (LOVENOX) injection 30 mg  30 mg Subcutaneous Q12H Hooten, Laurice Record, MD      . fentaNYL (SUBLIMAZE) 100 MCG/2ML injection           . ferrous sulfate tablet 325 mg  325 mg Oral BID WC Hooten, Laurice Record, MD      . Derrill Memo ON 02/23/2017] levothyroxine (SYNTHROID, LEVOTHROID) tablet 125 mcg  125 mcg Oral QAC breakfast Hooten, Laurice Record, MD      . magnesium hydroxide (MILK OF MAGNESIA) suspension 30 mL  30 mL Oral Daily PRN Hooten, Laurice Record, MD      . magnesium oxide (MAG-OX) tablet 400 mg  400 mg Oral Daily Hooten, Laurice Record, MD   400 mg at 02/22/17 1339  . menthol-cetylpyridinium (CEPACOL) lozenge 3 mg  1 lozenge Oral PRN Hooten, Laurice Record, MD       Or  . phenol (CHLORASEPTIC) mouth spray 1 spray  1 spray Mouth/Throat PRN Hooten, Laurice Record, MD      . metoCLOPramide (REGLAN) tablet 10 mg  10 mg Oral TID AC & HS Hooten, Laurice Record, MD      . morphine 2 MG/ML injection 2 mg  2 mg Intravenous Q2H PRN Hooten, Laurice Record, MD      . multivitamin-lutein (OCUVITE-LUTEIN) capsule 1 capsule  1 capsule Oral BID Hooten, Laurice Record, MD      . ondansetron (ZOFRAN) tablet 4 mg  4 mg Oral Q6H PRN Hooten, Laurice Record, MD       Or  . ondansetron (ZOFRAN) injection 4 mg  4 mg Intravenous Q6H PRN Hooten, Laurice Record, MD   4 mg at 02/22/17 1306  . oxyCODONE (Oxy IR/ROXICODONE) immediate release tablet 5-10 mg  5-10 mg Oral Q4H PRN Hooten, Laurice Record, MD   5 mg at 02/22/17 1313  . pantoprazole (PROTONIX) EC tablet 40 mg  40 mg Oral BID Dereck Leep, MD   40 mg at 02/22/17 1339  . polyethylene glycol (MIRALAX / GLYCOLAX) packet 17 g  17 g  Oral Q1400 Dereck Leep, MD   17 g at 02/22/17 1343  . polyvinyl alcohol (LIQUIFILM TEARS) 1.4 % ophthalmic solution 1 drop  1 drop Both Eyes TID PRN Hooten, Laurice Record, MD      . senna-docusate (Senokot-S) tablet 1 tablet  1 tablet Oral  BID Hooten, Laurice Record, MD      . sodium chloride (OCEAN) 0.65 % nasal spray 2 spray  2 spray Each Nare PRN Hooten, Laurice Record, MD      . sodium phosphate (FLEET) 7-19 GM/118ML enema 1 enema  1 enema Rectal Once PRN Hooten, Laurice Record, MD      . traMADol Veatrice Bourbon) tablet 50-100 mg  50-100 mg Oral Q4H PRN Hooten, Laurice Record, MD         Discharge Medications: Please see discharge summary for a list of discharge medications.  Relevant Imaging Results:  Relevant Lab Results:   Additional Information  (SSN: [id number])  Sample, Veronia Beets, LCSW

## 2017-02-23 DIAGNOSIS — L8990 Pressure ulcer of unspecified site, unspecified stage: Secondary | ICD-10-CM | POA: Insufficient documentation

## 2017-02-23 LAB — BASIC METABOLIC PANEL
Anion gap: 6 (ref 5–15)
BUN: 24 mg/dL — AB (ref 6–20)
CALCIUM: 7.8 mg/dL — AB (ref 8.9–10.3)
CO2: 23 mmol/L (ref 22–32)
CREATININE: 0.74 mg/dL (ref 0.61–1.24)
Chloride: 99 mmol/L — ABNORMAL LOW (ref 101–111)
GFR calc Af Amer: >60 mL/min (ref >60)
Glucose, Bld: 119 mg/dL — ABNORMAL HIGH (ref 65–99)
Potassium: 4.3 mmol/L (ref 3.5–5.1)
SODIUM: 128 mmol/L — AB (ref 135–145)

## 2017-02-23 LAB — CBC
HCT: 29.4 % — ABNORMAL LOW (ref 40.0–52.0)
Hemoglobin: 10.4 g/dL — ABNORMAL LOW (ref 13.0–18.0)
MCH: 31.5 pg (ref 26.0–34.0)
MCHC: 35.5 g/dL (ref 32.0–36.0)
MCV: 88.7 fL (ref 80.0–100.0)
PLATELETS: 182 10*3/uL (ref 150–440)
RBC: 3.31 MIL/uL — AB (ref 4.40–5.90)
RDW: 14.5 % (ref 11.5–14.5)
WBC: 7.7 10*3/uL (ref 3.8–10.6)

## 2017-02-23 MED ORDER — TRAMADOL HCL 50 MG PO TABS: 50.0000 mg | ORAL_TABLET | ORAL | 0 refills | Status: DC | PRN

## 2017-02-23 MED ORDER — OXYCODONE HCL 5 MG PO TABS: 5.0000 mg | ORAL_TABLET | ORAL | 0 refills | Status: DC | PRN

## 2017-02-23 MED ORDER — ENOXAPARIN SODIUM 30 MG/0.3ML ~~LOC~~ SOLN: 30.0000 mg | Freq: Two times a day (BID) | SUBCUTANEOUS | 0 refills | Status: DC

## 2017-02-23 NOTE — Progress Notes (Signed)
PT is recommending home health. Clinical Education officer, museum (CSW) met with patient and his daughter Jeannene Patella and made them aware of above. Patient and daughter are agreeable for patient to go home with [facility]. RN case manager aware of above. Please reconsult if future social work needs arise. CSW signing off.   [employer], LCSW [phone]

## 2017-02-23 NOTE — Anesthesia Postprocedure Evaluation (Signed)
Anesthesia Post Note  Patient: Duane Vasquez.  Procedure(s) Performed: Procedure(s) (LRB): TOTAL HIP ARTHROPLASTY (Left)  Patient location during evaluation: PACU Anesthesia Type: General Level of consciousness: awake and alert Pain management: pain level controlled Vital Signs Assessment: post-procedure vital signs reviewed and stable Respiratory status: spontaneous breathing, nonlabored ventilation, respiratory function stable and patient connected to nasal cannula oxygen Cardiovascular status: blood pressure returned to baseline and stable Postop Assessment: no apparent nausea or vomiting Anesthetic complications: no     Last Vitals:  Vitals:   02/23/17 0046 02/23/17 0758  BP: 133/65 (!) 176/78  Pulse: 67 97  Resp: 18 18  Temp: 36.7 C 36.9 C  SpO2: 94% 97%    Last Pain:  Vitals:   02/23/17 0931  TempSrc:   PainSc: 4                  Molli Barrows

## 2017-02-23 NOTE — Discharge Summary (Signed)
Physician Discharge Summary  Patient ID: Duane Vasquez. MRN: [id number] DOB/AGE: 81/07/1925 81 y.o.  Admit date: 02/22/2017 Discharge date: 02/25/2017  Admission Diagnoses:  primary osteoarthritis of left hip   Discharge Diagnoses: Patient Active Problem List   Diagnosis Date Noted  . Pressure injury of skin 02/23/2017  . Status post total replacement of hip 02/22/2017  . S/P total hip arthroplasty 06/27/2016  . Lumbar radiculitis 07/14/2015  . DDD (degenerative disc disease), lumbar 07/14/2015  . Parkinson's disease ([place]) 05/20/2015  . CSR (central serous retinopathy), left 10/18/2014  . Benign meningioma ([place]) 10/18/2014  . Vitamin D deficiency 10/02/2014  . Carotid artery stenosis 10/02/2014  . Paroxysmal SVT (supraventricular tachycardia) ([place]) 07/30/2014  . Hypothyroidism due to acquired atrophy of thyroid 04/01/2014  . Hypertension 02/12/2014  . Sleep apnea 11/13/2013  . Hyperlipidemia, unspecified 11/13/2013  . Hereditary and idiopathic peripheral neuropathy 09/06/2013  . Inguinal hernia 09/06/2013  . Increased frequency of urination 03/21/2012  . Personal history of prostate cancer 03/21/2012  . Hemorrhoids 03/21/2012    Past Medical History:  Diagnosis Date  . Arthritis   . Cancer [facility])    prostate  . Dyspnea   . Dysrhythmia    svt  . HOH (hard of hearing)   . Hypertension   . Hypothyroidism   . Melanoma ([place])   . Parkinson disease ([place])   . PONV (postoperative nausea and vomiting)   . Sleep apnea    uses cpap  . Stroke [facility])    tia many many years ago.  Marland Kitchen SVT (supraventricular tachycardia) ([place])   . TIA (transient ischemic attack)      Transfusion: No transfusions during this admission   Consultants (if any):   Discharged Condition: Improved  Hospital Course: Duane J Tameem Pullara. is an 83 y.o. male who was admitted 02/22/2017 with a diagnosis of degenerative arthrosis left hip and went to the operating room on 02/22/2017 and underwent the  above named procedures.    Surgeries:Procedure(s): TOTAL HIP ARTHROPLASTY on 02/22/2017  PRE-OPERATIVE DIAGNOSIS: Degenerative arthrosis of the left hip, primary  POST-OPERATIVE DIAGNOSIS:  Same  PROCEDURE:  Left total hip arthroplasty  SURGEON:  Duane Vasquez. M.D.  ASSISTANT:  Vance Peper, PA (present and scrubbed throughout the case, critical for assistance with exposure, retraction, instrumentation, and closure)  ANESTHESIA: general  ESTIMATED BLOOD LOSS: 175 mL  FLUIDS REPLACED: 1300 mL of crystalloid  DRAINS: 2 medium drains to a Hemovac reservoir  IMPLANTS UTILIZED: DePuy 15 mm small stature AML femoral stem, 58 mm OD Pinnacle 100 acetabular component, +4 mm 10 Pinnacle Marathon polyethylene insert, and a 36 mm M-SPEC +1.5 mm hip ball  INDICATIONS FOR SURGERY: Duane J Syler Norcia. is a 83 y.o. year old male with a long history of progressive hip and groin  pain. X-rays demonstrated severe degenerative changes. The patient had not seen any significant improvement despite conservative nonsurgical intervention. After discussion of the risks and benefits of surgical intervention, the patient expressed understanding of the risks benefits and agree with plans for total hip arthroplasty.   The risks, benefits, and alternatives were discussed at length including but not limited to the risks of infection, bleeding, nerve injury, stiffness, blood clots, the need for revision surgery, limb length inequality, dislocation, cardiopulmonary complications, among others, and they were willing to proceed. Patient tolerated the surgery well. No complications .Patient was taken to PACU where she was stabilized and then transferred to the orthopedic floor.  Patient started on Lovenox 30 mg  q 12 hrs. Foot pumps applied bilaterally at 80 mm hgb. Heels elevated off bed with rolled towels. No evidence of DVT. Calves non tender. Negative Homan. Physical therapy started on day #1 for gait  training and transfer with OT starting on  day #1 for ADL and assisted devices. Patient has done well with therapy.   Patient's IV And Foley were discontinued on day #1 with Hemovac being discontinued on day #2. Dressing was changed on day 2 prior to patient being discharged   He was given perioperative antibiotics:  Anti-infectives    Start     Dose/Rate Route Frequency Ordered Stop   02/22/17 1330  clindamycin (CLEOCIN) IVPB 600 mg     600 mg 100 mL/hr over 30 Minutes Intravenous Every 6 hours 02/22/17 1235 02/23/17 0826   02/22/17 0616  clindamycin (CLEOCIN) 900 MG/50ML IVPB    Comments:  Duane Vasquez   : cabinet override      02/22/17 0616 02/22/17 0730   02/22/17 0600  clindamycin (CLEOCIN) IVPB 900 mg     900 mg 100 mL/hr over 30 Minutes Intravenous On call to O.R. 02/21/17 2224 02/22/17 0750    .  He was fitted with AV 1 compression foot pump devices, instructed on heel pumps, early ambulation, and fitted with TED stockings bilaterally for DVT prophylaxis.  He benefited maximally from the hospital stay and there were no complications.    Recent vital signs:  Vitals:   02/23/17 1429 02/23/17 2005  BP: (!) 157/87 (!) 154/83  Pulse: 75 92  Resp:  16  Temp:  98.6 F (37 C)  SpO2: 94% 95%    Recent laboratory studies:  Lab Results  Component Value Date   HGB 10.8 (L) 02/24/2017   HGB 10.4 (L) 02/23/2017   HGB 14.1 02/09/2017   Lab Results  Component Value Date   WBC 9.9 02/24/2017   PLT 187 02/24/2017   Lab Results  Component Value Date   INR 1.08 02/09/2017   Lab Results  Component Value Date   NA 130 (L) 02/24/2017   K 3.6 02/24/2017   CL 99 (L) 02/24/2017   CO2 24 02/24/2017   BUN 16 02/24/2017   CREATININE 0.64 02/24/2017   GLUCOSE 98 02/24/2017    Discharge Medications:   Allergies as of 02/24/2017      Reactions   Amoxicillin Diarrhea   Has patient had a PCN reaction causing immediate rash, facial/tongue/throat swelling, SOB or lightheadedness  with hypotension:No Has patient had a PCN reaction causing severe rash involving mucus membranes or skin necrosis:No Has patient had a PCN reaction that required hospitalization No Has patient had a PCN reaction occurring within the last 10 years:Yes If all of the above answers are "NO", then may proceed with Cephalosporin use.   Ceftin [cefuroxime Axetil] Nausea Only      Medication List    STOP taking these medications   aspirin 325 MG tablet     TAKE these medications   acetaminophen 500 MG tablet Commonly known as:  TYLENOL Take 1,000 mg by mouth every 6 (six) hours as needed (for pain.).   carbidopa-levodopa 25-100 MG tablet Commonly known as:  SINEMET IR Take 1 tablet by mouth 3 (three) times daily.   cholecalciferol 1000 units tablet Commonly known as:  VITAMIN D Take 1,000 Units by mouth 2 (two) times daily.   clindamycin 300 MG capsule Commonly known as:  CLEOCIN Take 600 mg by mouth See admin instructions. Only before dental procedures  diltiazem 180 MG 24 hr capsule Commonly known as:  CARDIZEM CD Take 180 mg by mouth daily.   enoxaparin 30 MG/0.3ML injection Commonly known as:  LOVENOX Inject 0.3 mLs (30 mg total) into the skin every 12 (twelve) hours.   GAVISCON EXTRA STRENGTH 508-475 MG/10ML Susp Generic drug:  Alum Hydroxide-Mag Carbonate Take 5 mLs by mouth daily as needed.   hydroxypropyl methylcellulose / hypromellose 2.5 % ophthalmic solution Commonly known as:  ISOPTO TEARS / GONIOVISC Place 1 drop into both eyes 3 (three) times daily as needed for dry eyes.   MAGOX 400 400 (241.3 Mg) MG tablet Generic drug:  magnesium oxide Take 400 mg by mouth daily.   MIRALAX powder Generic drug:  polyethylene glycol powder Take 17 g by mouth daily at 2 PM.   MUSCLE RUB 10-15 % Crea Apply 1 application topically 4 (four) times daily as needed for muscle pain.   oxyCODONE 5 MG immediate release tablet Commonly known as:  Oxy IR/ROXICODONE Take 1-2  tablets (5-10 mg total) by mouth every 4 (four) hours as needed for severe pain.   PHILLIPS COLON HEALTH Caps Take 1 capsule by mouth daily with lunch.   PRESERVISION AREDS 2+MULTI VIT PO Take 1 tablet by mouth 2 (two) times daily.   sodium chloride 0.65 % Soln nasal spray Commonly known as:  OCEAN Place 2 sprays into both nostrils as needed for congestion.   SYNTHROID 125 MCG tablet Generic drug:  levothyroxine Take 125 mcg by mouth daily before breakfast.   traMADol 50 MG tablet Commonly known as:  ULTRAM Take 1-2 tablets (50-100 mg total) by mouth every 4 (four) hours as needed for moderate pain.            Durable Medical Equipment        Start     Ordered   02/22/17 1236  DME Walker rolling  Once    Question:  Patient needs a walker to treat with the following condition  Answer:  S/P total hip arthroplasty   02/22/17 1235   02/22/17 1236  DME Bedside commode  Once    Question:  Patient needs a bedside commode to treat with the following condition  Answer:  S/P total hip arthroplasty   02/22/17 1235       Discharge Care Instructions        Start     Ordered   02/23/17 0000  enoxaparin (LOVENOX) 30 MG/0.3ML injection  Every 12 hours     02/23/17 0746   02/23/17 0000  oxyCODONE (OXY IR/ROXICODONE) 5 MG immediate release tablet  Every 4 hours PRN     02/23/17 0746   02/23/17 0000  traMADol (ULTRAM) 50 MG tablet  Every 4 hours PRN     02/23/17 0746   02/23/17 0000  Increase activity slowly     02/23/17 0746   02/23/17 0000  Diet - low sodium heart healthy     02/23/17 0746      Diagnostic Studies: Dg Hip Port Unilat With Pelvis 1v Left  Result Date: 02/22/2017 CLINICAL DATA:  Status post total hip replacement on the left EXAM: DG HIP (WITH OR WITHOUT PELVIS) 1V PORT LEFT COMPARISON:  June 27, 2016 FINDINGS: Frontal pelvis and lateral left hip images were obtained. There are total hip replacements bilaterally with prosthetic components bilaterally  well-seated. No acute fracture or dislocation. Bones are diffusely osteoporotic. There are seed implants in the prostate. There is mesh in the left lower pelvic region. There is  a surgical drain in the left hip joint region. IMPRESSION: Status post total hip replacements bilaterally with prosthetic components well-seated. Surgical drain noted on the left. Bones diffusely osteoporotic. No acute fracture or dislocation. Seed implants in the prostate. Postoperative change with mesh lower left pelvis. Electronically Signed   By: Lowella Grip III M.D.   On: 02/22/2017 12:23    Disposition: 01-Home or Self Care  Discharge Instructions    Diet - low sodium heart healthy    Complete by:  As directed    Increase activity slowly    Complete by:  As directed       Follow-up Information    Hooten, Laurice Record, MD Follow up on 04/06/2017.   Specialty:  Orthopedic Surgery Why:  at 9:45am Contact information: [place] [id number] [phone]            Signed: Feliberto Gottron 02/24/2017, 6:26 AM

## 2017-02-23 NOTE — Progress Notes (Signed)
Physical Therapy Treatment Patient Details Name: Duane Vasquez. MRN: [id number] DOB: 1925/06/02 Today's Date: 02/23/2017    History of Present Illness Ptis a 83 yo male with degenerative arthrosis of the L hip and is s/p L THA. Hx R THA in January. PMH includes: arthritis, prostate CA, SVT, HOH, HTN, hypothyroidism, melanoma, Parkinson's disease, and TIA.    PT Comments    Pt experiencing 3-4/10 pain in both L hip and knee; medicated before session and monitored throughout. Able to progress bed exercises and ambulate 121ft with RW, CGAx1 and vc's for maintaining hip precautions while turning. Sit>stand required min A and min vc's for hand and foot placement to maintain hip precautions. Pt would benefit from continued PT services with home health PT to address the noted impairments.   Follow Up Recommendations  Home health PT     Equipment Recommendations  None recommended by PT    Recommendations for Other Services       Precautions / Restrictions Precautions Precautions: Posterior Hip Precaution Booklet Issued: No Precaution Comments: Pt able to recall 3/3 posterior THPs Restrictions Weight Bearing Restrictions: Yes LLE Weight Bearing: Weight bearing as tolerated    Mobility  Bed Mobility Overal bed mobility: Needs Assistance Bed Mobility: Supine to Sit      Supine to sit: supervision   General bed mobility comments: close supervision, pt able to perform with increased effort, use of bed rail, maintaining posterior THPs throughout  Transfers Overall transfer level: Needs assistance Equipment used: Rolling walker (2 wheeled) Transfers: Sit to/from Stand Sit to Stand: Min assist         General transfer comment: CGAx1 and Vc's for hand and foot placement during sit to stand from elevated EOB and stand>sit in chair to maintain compliance with posterior hip precautions  Ambulation/Gait Ambulation/Gait assistance: Min guard Ambulation Distance (Feet): 100  Feet Assistive device: Rolling walker (2 wheeled) Gait Pattern/deviations: Step-to pattern;Decreased step length - right;Decreased stance time - left Gait velocity: decreased   General Gait Details: Vc's to turn L foot out prior to turning trunk while making turns to maintain hip precautions   Stairs            Wheelchair Mobility    Modified Rankin (Stroke Patients Only)       Balance Overall balance assessment: Needs assistance Sitting-balance support: No upper extremity supported;Feet supported Sitting balance-Leahy Scale: Good     Standing balance support: Bilateral upper extremity supported Standing balance-Leahy Scale: Fair                              Cognition Arousal/Alertness: Awake/alert Behavior During Therapy: WFL for tasks assessed/performed Overall Cognitive Status: Within Functional Limits for tasks assessed                                        Exercises Total Joint Exercises Ankle Circles/Pumps: Both;20 reps;Supine;AROM Quad Sets: Strengthening;Both;10 reps;Supine (10 reps x2) Gluteal Sets: Strengthening;Both;10 reps;Supine (10 reps x2) Short Arc Quad: AROM;Both;10 reps;Supine (10 reps x2) Hip ABduction/ADduction: AROM;Both;10 reps;Supine (10 reps x2)     General Comments  Pt agreeable to PT      Pertinent Vitals/Pain Pain Assessment: 0-10 Pain Score: 3  Pain Location: L hip (L knee pain 4-5/10; L hip pain 4/10 with activity; L hip 3/10 pain end of session) Pain Descriptors / Indicators: Operative  site guarding;Sore Pain Intervention(s): Limited activity within patient's tolerance;Monitored during session;Premedicated before session    [place] expects to be discharged to:: Private residence Living Arrangements: Spouse/significant other Available Help at Discharge: Family;Available 24 hours/day;Available PRN/intermittently;Other (Comment) (spouse 24/7, slightly limited with physical assist;  daughter and son available as needed/PRN) Type of Home: House Home Access: Stairs to enter   Home Layout: One level Home Equipment: Environmental consultant - 2 wheels;Other (comment);Walker - 4 wheels;Shower seat;Bedside commode;Grab bars - tub/shower;Adaptive equipment (Parkinson's walker, lift recliner)      Prior Function Level of Independence: Independent with assistive device(s)      Comments: Mod Ind with amb limited community distances with combination of RW, rollator, and Parkinson's walker, one recent LOB but no fall to floor. Modified indep with ADL tasks using AE.   PT Goals (current goals can now be found in the care plan section) Acute Rehab PT Goals Patient Stated Goal: To walk better and return home PT Goal Formulation: With patient Time For Goal Achievement: 03/07/17 Potential to Achieve Goals: Good Progress towards PT goals: Progressing toward goals    Frequency    BID      PT Plan Current plan remains appropriate    Co-evaluation              AM-PAC PT "6 Clicks" Daily Activity  Outcome Measure  Difficulty turning over in bed (including adjusting bedclothes, sheets and blankets)?: A Little Difficulty moving from lying on back to sitting on the side of the bed? : A Lot Difficulty sitting down on and standing up from a chair with arms (e.g., wheelchair, bedside commode, etc,.)?: Unable Help needed moving to and from a bed to chair (including a wheelchair)?: A Little Help needed walking in hospital room?: A Little Help needed climbing 3-5 steps with a railing? : A Lot 6 Click Score: 14    End of Session Equipment Utilized During Treatment: Gait belt Activity Tolerance: Patient tolerated treatment well Patient left: in chair;with call bell/phone within reach;with chair alarm set;with nursing/sitter in room (pillows between legs; heels elevated; nursing to reapply SCD's when finished giving meds) Nurse Communication: Mobility status PT Visit Diagnosis: Other  abnormalities of gait and mobility (R26.89);Muscle weakness (generalized) (M62.81)     Time: 5093-2671 PT Time Calculation (min) (ACUTE ONLY): 37 min  Charges:                       G CodesWetzel Bjornstad, SPT 02/23/2017, 10:47 AM

## 2017-02-23 NOTE — Clinical Social Work Note (Signed)
Clinical Social Work Assessment  Patient Details  Name: Duane J Schryver Jr. MRN: [id number] Date of Birth: 02/04/1926  Date of referral:  02/23/17               Reason for consult:  Facility Placement                Permission sought to share information with:  Facility Contact Representative Permission granted to share information::  Yes, Verbal Permission Granted  Name::      Skilled Nursing Facility   Agency::    County   Relationship::     Contact Information:     Housing/Transportation Living arrangements for the past 2 months:  Single Family Home Source of Information:  Patient, Adult Children Patient Interpreter Needed:  None Criminal Activity/Legal Involvement Pertinent to Current Situation/Hospitalization:  No - Comment as needed Significant Relationships:  Adult Children, Spouse Lives with:  Spouse Do you feel safe going back to the place where you live?  Yes Need for family participation in patient care:  Yes (Comment)  Care giving concerns:  Patient lives in [place] with his wife Duane Vasquez.    Social Worker assessment / plan:  Clinical Social Worker (CSW) received SNF consult. PT recommended SNF on post op day 0. CSW met with patient and his daughter/ HPOA Duane Vasquez [phone] was at bedside. CSW introduced self and explained role of CSW department. Patient was alert and oriented X4 and was sitting up in the bed. Patient reported that he lives in [place] with his wife and his daughter Duane Vasquez is his HPOA. CSW explained that PT has recommended SNF on post op day 0 however patient may progress to home health. CSW explained SNF process. Patient reported that he prefers to go home with [facility] and requested PT Kelly Carter. Patient reported that he had a hip surgery in January 2018 and went home with home health and did well. Patient reported that he would consider SNF if he truly needs it however he prefers to go home. RN case manager aware of above. CSW will continue to follow and  assist as needed.   Employment status:  Retired Insurance information:  Medicare PT Recommendations:  Skilled Nursing Facility Information / Referral to community resources:  Skilled Nursing Facility  Patient/Family's Response to care:  Patient prefers to go home with home health.   Patient/Family's Understanding of and Emotional Response to Diagnosis, Current Treatment, and Prognosis:  Patient and his daughter were very pleasant and thanked CSW for assistance. Patient and his daughter attended joint class and are familiar with hip surgery recovery.   Emotional Assessment Appearance:  Appears stated age Attitude/Demeanor/Rapport:    Affect (typically observed):  Accepting, Adaptable, Pleasant Orientation:  Oriented to Self, Oriented to Place, Oriented to  Time, Oriented to Situation Alcohol / Substance use:  Not Applicable Psych involvement (Current and /or in the community):  No (Comment)  Discharge Needs  Concerns to be addressed:  Discharge Planning Concerns Readmission within the last 30 days:  No Current discharge risk:  Dependent with Mobility Barriers to Discharge:  Continued Medical Work up   Sample, Bailey M, LCSW 02/23/2017, 8:41 AM  

## 2017-02-23 NOTE — Evaluation (Signed)
Occupational Therapy Evaluation Patient Details Name: Duane Vasquez. MRN: [id number] DOB: 1925/07/07 Today's Date: 02/23/2017    History of Present Illness Ptis a 83 yo male with degenerative arthrosis of the L hip and is s/p L THA. Hx R THA in January. PMH includes: arthritis, prostate CA, SVT, HOH, HTN, hypothyroidism, melanoma, Parkinson's disease, and TIA.   Clinical Impression   Pt is an 83 year old male s/p L THR(posterior THPs), POD#1 for OT evaluation with previous R THA in January 2018. Pt lives at home with his spouse with a son and daughter living locally who are very supportive and involved. Pt was modified independent in all functional mobility and ADLs prior to surgery and is eager to return to PLOF.  Pt is currently limited in functional ADLs due to pain and decreased ROM. Pt requires minimal assist for LB dressing and bathing skills due to pain and decreased AROM of L LE. Pt able to recall 3/3 posterior THPs with only 1 verbal cue to recall the 3rd precaution and was able to maintain precautions throughout bed mobility and ADL. Daughter present for session, was also here for hip replacement in January. Good home set up and family supports in place. Pt would benefit from continued skilled OT services for education in assistive devices, functional mobility/transfer training, and education in recommendations for home modifications to increase safety and prevent falls. Anticipate pt will be safe to return home with home health OT/PT services pending pt's progress during this hospitalization. Will continue to assess for appropriateness for rehabilitation.      Follow Up Recommendations  Home health OT    Equipment Recommendations  None recommended by OT    Recommendations for Other Services       Precautions / Restrictions Precautions Precautions: Posterior Hip Precaution Booklet Issued: No Precaution Comments: Pt able to recall 3/3 posterior THPs with 1 verbal cue for 3rd  precaution. Restrictions Weight Bearing Restrictions: Yes LLE Weight Bearing: Weight bearing as tolerated      Mobility Bed Mobility Overal bed mobility: Needs Assistance Bed Mobility: Supine to Sit;Sit to Supine     Supine to sit: Supervision Sit to supine: Supervision   General bed mobility comments: close supervision, pt able to perform with increased effort, use of bed rail, maintaining posterior THPs throughout  Transfers Overall transfer level: Needs assistance Equipment used: Rolling walker (2 wheeled) Transfers: Sit to/from Stand Sit to Stand: Min guard              Balance Overall balance assessment: Needs assistance Sitting-balance support: No upper extremity supported;Feet supported Sitting balance-Leahy Scale: Good                                     ADL either performed or assessed with clinical judgement   ADL Overall ADL's : Needs assistance/impaired Eating/Feeding: Sitting;Set up   Grooming: Sitting;Set up   Upper Body Bathing: Sitting;Set up;Supervision/ safety   Lower Body Bathing: Sitting/lateral leans;Minimal assistance;Adhering to hip precautions Lower Body Bathing Details (indicate cue type and reason): min guard if using AE Upper Body Dressing : Sitting;Set up;Supervision/safety   Lower Body Dressing: With adaptive equipment;Min guard;Adhering to hip precautions;Minimal assistance;Sitting/lateral leans   Toilet Transfer: Min guard;Ambulation;Comfort height toilet;RW           Functional mobility during ADLs: Min guard;Rolling walker       Vision Baseline Vision/History: Wears glasses Wears Glasses: At  all times Patient Visual Report: No change from baseline Vision Assessment?: No apparent visual deficits     Perception     Praxis      Pertinent Vitals/Pain Pain Assessment: 0-10 Pain Score: 4  Pain Location: L hip and L knee Pain Descriptors / Indicators: Operative site guarding;Sore Pain Intervention(s):  Limited activity within patient's tolerance;Monitored during session;Repositioned;Patient requesting pain meds-RN notified     Hand Dominance Right   Extremity/Trunk Assessment Upper Extremity Assessment Upper Extremity Assessment: Overall WFL for tasks assessed   Lower Extremity Assessment Lower Extremity Assessment: Defer to PT evaluation;LLE deficits/detail   Cervical / Trunk Assessment Cervical / Trunk Assessment: Normal   Communication Communication Communication: No difficulties;HOH (chart indicates HOH)   Cognition Arousal/Alertness: Awake/alert Behavior During Therapy: WFL for tasks assessed/performed Overall Cognitive Status: Within Functional Limits for tasks assessed                                     General Comments       Exercises Other Exercises Other Exercises: Pt/family educated in home/routines modifications and home set up to maximize safety, pt/daughter verbalized understanding   Shoulder Instructions      Home Living Family/patient expects to be discharged to:: Private residence Living Arrangements: Spouse/significant other Available Help at Discharge: Family;Available 24 hours/day;Available PRN/intermittently;Other (Comment) (spouse 24/7, slightly limited with physical assist; daughter and son available as needed/PRN) Type of Home: House Home Access: Stairs to enter [employer] of Steps: 1 stair with grab bar then a landing followed by a small threshold without a rail   Home Layout: One level     Bathroom Shower/Tub: Walk-in shower;Door   Bathroom Toilet: Handicapped height Bathroom Accessibility: Yes   Home Equipment: Environmental consultant - 2 wheels;Other (comment);Walker - 4 wheels;Shower seat;Bedside commode;Grab bars - tub/shower;Adaptive equipment (Parkinson's walker, lift recliner) Adaptive Equipment: Reacher;Sock aid        Prior Functioning/Environment Level of Independence: Independent with assistive device(s)         Comments: Mod Ind with amb limited community distances with combination of RW, rollator, and Parkinson's walker, one recent LOB but no fall to floor. Modified indep with ADL tasks using AE.        OT Problem List: Decreased strength;Decreased range of motion;Decreased activity tolerance;Pain      OT Treatment/Interventions: Self-care/ADL training;Therapeutic exercise;Therapeutic activities;Energy conservation;DME and/or AE instruction;Patient/family education    OT Goals(Current goals can be found in the care plan section) Acute Rehab OT Goals Patient Stated Goal: To walk better and return home OT Goal Formulation: With patient/family Time For Goal Achievement: 03/02/17 Potential to Achieve Goals: Good ADL Goals Pt Will Perform Lower Body Dressing: with set-up;with adaptive equipment;sitting/lateral leans;sit to/from stand;with supervision Pt Will Transfer to Toilet: with min guard assist;ambulating (comfort height toilet, RW for ambulation)  OT Frequency: Min 1X/week   Barriers to D/C:            Co-evaluation              AM-PAC PT "6 Clicks" Daily Activity     Outcome Measure Help from another person eating meals?: None Help from another person taking care of personal grooming?: None Help from another person toileting, which includes using toliet, bedpan, or urinal?: A Little Help from another person bathing (including washing, rinsing, drying)?: A Little Help from another person to put on and taking off regular upper body clothing?: None Help from another person  to put on and taking off regular lower body clothing?: A Little 6 Click Score: 21   End of Session    Activity Tolerance: Patient tolerated treatment well Patient left: in bed;with call bell/phone within reach;with bed alarm set;with family/visitor present;with SCD's reapplied  OT Visit Diagnosis: Other abnormalities of gait and mobility (R26.89);Pain Pain - Right/Left: Left Pain - part of body: Hip                 Time: 7824-2353 OT Time Calculation (min): 29 min Charges:  OT General Charges $OT Visit: 1 Visit OT Evaluation $OT Eval Low Complexity: 1 Low OT Treatments $Self Care/Home Management : 8-22 mins G-Codes: OT G-codes **NOT FOR INPATIENT CLASS** Functional Assessment Tool Used: AM-PAC 6 Clicks Daily Activity;Clinical judgement Functional Limitation: Self care Self Care Current Status (I1443): At least 20 percent but less than 40 percent impaired, limited or restricted Self Care Goal Status (X5400): At least 1 percent but less than 20 percent impaired, limited or restricted   Jeni Salles, MPH, MS, OTR/L ascom [phone] 02/23/17, 10:00 AM

## 2017-02-23 NOTE — Care Management Note (Addendum)
Case Management Note  Patient Details  Name: Duane Vasquez. MRN: [id number] Date of Birth: 13-Mar-1926  Subjective/Objective:                  Met with patient and his daughter to discuss discharge planning. He plans to return home with his wife at discharge.  He has a walker and bedside commode available for use in the home. He uses [facility] for medications [phone]. Per Dr. Skip Estimable patient will need Lovenox '40mg'$  injection daily for 14 days-no refills- called to patient's pharmacy. He states he would like to use [facility] for home health - He requests [facility] for [place].  Action/Plan: Referral made to [facility] for home health with his request for kelly.  Lovenox called to [facility]. Lovenox price for patient $20.00- patient informed and agrees.   Expected Discharge Date:                  Expected Discharge Plan:     In-House Referral:     Discharge planning Services  CM Consult  Post Acute Care Choice:  Home Health Choice offered to:  Patient, Adult Children  DME Arranged:    DME Agency:     HH Arranged:  PT [place]:  [place] (now [facility])  Status of Service:  In process, will continue to follow  If discussed at Long Length of Stay Meetings, dates discussed:    Additional Comments:  Marshell Garfinkel, RN 02/23/2017, 10:21 AM

## 2017-02-23 NOTE — Clinical Social Work Placement (Signed)
   CLINICAL SOCIAL WORK PLACEMENT  NOTE  Date:  02/23/2017  Patient Details  Name: Duane Vasquez. MRN: [id number] Date of Birth: 1925/06/24  Clinical Social Work is seeking post-discharge placement for this patient at the [place] level of care (*CSW will initial, date and re-position this form in  chart as items are completed):  Yes   Patient/family provided with [place] Work Department's list of facilities offering this level of care within the geographic area requested by the patient (or if unable, by the patient's family).  Yes   Patient/family informed of their freedom to choose among providers that offer the needed level of care, that participate in Medicare, Medicaid or managed care program needed by the patient, have an available bed and are willing to accept the patient.  Yes   Patient/family informed of [facility]'s ownership interest in [facility] and [facility], as well as of the fact that they are under no obligation to receive care at these facilities.  PASRR submitted to EDS on       PASRR number received on       Existing PASRR number confirmed on 02/23/17     FL2 transmitted to all facilities in geographic area requested by pt/family on 02/23/17     FL2 transmitted to all facilities within larger geographic area on       Patient informed that his/her managed care company has contracts with or will negotiate with certain facilities, including the following:            Patient/family informed of bed offers received.  Patient chooses bed at       Physician recommends and patient chooses bed at      Patient to be transferred to   on  .  Patient to be transferred to facility by       Patient family notified on   of transfer.  Name of family member notified:        PHYSICIAN       Additional Comment:    _______________________________________________ Sadarius Norman, Veronia Beets, LCSW 02/23/2017, 8:40 AM

## 2017-02-23 NOTE — Progress Notes (Signed)
Physical Therapy Treatment Patient Details Name: Duane Vasquez. MRN: [id number] DOB: [birth year] Today's Date: 02/23/2017    History of Present Illness Ptis a 83 yo male with degenerative arthrosis of the L hip and is s/p L THA. Hx R THA in January. PMH includes: arthritis, prostate CA, SVT, HOH, HTN, hypothyroidism, melanoma, Parkinson's disease, and TIA.    PT Comments    Pt able to progress LE AROM/strengthening exercises with only c/o increased L hip pain from baseline 4/10 to 8/10 during AROM hip ABD/ADD. CGAx1 and vc's during sit to/from stand to push through UE/LE during standing and to maintain hip precautions. Pt able to ambulate 136ft with RW and only c/o fatigue. CGAx1 and vc's during gait for keeping RW closer to body and for maintaining hip precautions. PT noted slight buckling of knees while walking; corrected with vc's. Pain monitored throughout session; increased to 6/10 in L hip with activity; decreased to 4/10 pain at end of session. Pt reports he felt "a little winded" after activity.   Follow Up Recommendations  Home health PT     Equipment Recommendations  None recommended by PT    Recommendations for Other Services       Precautions / Restrictions Precautions Precautions: Posterior Hip Precaution Comments: Pt able to recall 3/3 posterior THPs Restrictions Weight Bearing Restrictions: Yes LLE Weight Bearing: Weight bearing as tolerated    Mobility  Bed Mobility Overal bed mobility: Needs Assistance Bed Mobility: Supine to Sit;Sit to Supine     Supine to sit: Supervision Sit to supine: Supervision   General bed mobility comments: Close supervision. Increased time and effort; used bed rail, min vc's for sequencing while scooting to head of bed  Transfers Overall transfer level: Needs assistance Equipment used: Rolling walker (2 wheeled) Transfers: Sit to/from Stand Sit to Stand: Min guard         General transfer comment: CGAx1 and Vc's for  hand and foot placement during sit to/from stand to maintain compliance with posterior hip precautions; vc's required to push through legs and arms to stand up; increased time and effort to perform sit to stand  Ambulation/Gait Ambulation/Gait assistance: Min guard Ambulation Distance (Feet): 160 Feet Assistive device: Rolling walker (2 wheeled) Gait Pattern/deviations: Step-to pattern;Decreased step length - right;Decreased stance time - left Gait velocity: decreased Gait velocity interpretation: Below normal speed for age/gender General Gait Details: Knee's buckling slightly; vc's to keep RW closer to body and push through arms to offweight LEs more; min vc's to turn L foot prior to turning body while making L turns   Financial trader [place] (Stroke Patients Only)       Balance Overall balance assessment: Needs assistance Sitting-balance support: No upper extremity supported;Feet supported Sitting balance-Leahy Scale: Good     Standing balance support: Bilateral upper extremity supported Standing balance-Leahy Scale: Fair                              Cognition Arousal/Alertness: Awake/alert Behavior During Therapy: WFL for tasks assessed/performed Overall Cognitive Status: Within Functional Limits for tasks assessed                                        Exercises Total Joint Exercises Ankle Circles/Pumps: Both;20 reps;Supine;AROM Quad Sets:  Strengthening;Both;Supine;15 reps (15 reps x2) Gluteal Sets: Strengthening;Both;Supine;15 reps (15 reps x2) Hip ABduction/ADduction: AROM;Both;Supine;15 reps (15 reps x2 on R; 12 reps x2 on L) Long Arc Quad: AROM;Both;15 reps;Seated (15 reps x2)    General Comments General comments (skin integrity, edema, etc.): Pt picked up hemovac and tubing fell off; required sterile clean up; nursing notified to address situation.      Pertinent Vitals/Pain Pain Assessment:  0-10 Pain Score: 4  Pain Location: L hip (L hip pain 8/10 during hip ABD/ADD exercise; 6/10 while walking; 4-5/10 end of session) Pain Descriptors / Indicators: Operative site guarding;Sore Pain Intervention(s): Limited activity within patient's tolerance;Monitored during session    Home Living                      Prior Function            PT Goals (current goals can now be found in the care plan section) Acute Rehab PT Goals Patient Stated Goal: To walk better and return home PT Goal Formulation: With patient Time For Goal Achievement: 03/07/17 Potential to Achieve Goals: Good Progress towards PT goals: Progressing toward goals    Frequency    BID      PT Plan Current plan remains appropriate    Co-evaluation              AM-PAC PT "6 Clicks" Daily Activity  Outcome Measure  Difficulty turning over in bed (including adjusting bedclothes, sheets and blankets)?: A Little Difficulty moving from lying on back to sitting on the side of the bed? : A Little Difficulty sitting down on and standing up from a chair with arms (e.g., wheelchair, bedside commode, etc,.)?: A Lot Help needed moving to and from a bed to chair (including a wheelchair)?: A Little Help needed walking in hospital room?: A Little Help needed climbing 3-5 steps with a railing? : A Lot 6 Click Score: 16    End of Session Equipment Utilized During Treatment: Gait belt Activity Tolerance: Patient tolerated treatment well Patient left: with call bell/phone within reach;with nursing/sitter in room;in bed;with bed alarm set;with SCD's reapplied (pillows between legs; heels elevated) Nurse Communication: Mobility status (pt pulled out hemovac; sterile cleanup required) PT Visit Diagnosis: Other abnormalities of gait and mobility (R26.89);Muscle weakness (generalized) (M62.81)     Time: 5277-8242 PT Time Calculation (min) (ACUTE ONLY): 39 min  Charges:  $Gait Training: 8-22  mins $Therapeutic Exercise: 8-22 mins                    G Codes:       Wetzel Bjornstad, SPT 02/23/2017, 2:57 PM

## 2017-02-23 NOTE — Discharge Instructions (Signed)
Instructions after Total Hip Replacement     James P. Holley Bouche., M.D.     Dept. of [facility]  [address] [place], [place]  [address]  Phone: [phone]   Fax: [phone]    DIET:  Drink plenty of non-alcoholic fluids.  Resume your normal diet. Include foods high in fiber.  ACTIVITY:   You may use crutches or a walker with weight-bearing as tolerated, unless instructed otherwise.  You may be weaned off of the walker or crutches by your Physical Therapist.   Do NOT reach below the level of your knees or cross your legs until allowed.     Continue doing gentle exercises. Exercising will reduce the pain and swelling, increase motion, and prevent muscle weakness.    Please continue to use the TED compression stockings for 6 weeks. You may remove the stockings at night, but should reapply them in the morning.  Do not drive or operate any equipment until instructed.  WOUND CARE:   Continue to use ice packs periodically to reduce pain and swelling.  Keep the incision clean and dry.  You may bathe or shower after the staples are removed at the first office visit following surgery.  MEDICATIONS:  You may resume your regular medications.  Please take the pain medication as prescribed on the medication.  Do not take pain medication on an empty stomach.  You have been given a prescription for a blood thinner to prevent blood clots. Please take the medication as instructed. (NOTE: After completing a 2 week course of Lovenox, take one Enteric-coated aspirin once a day.)  Pain medications and iron supplements can cause constipation. Use a stool softener (Senokot or Colace) on a daily basis and a laxative (dulcolax or miralax) as needed.  Do not drive or drink alcoholic beverages when taking pain medications.  CALL THE OFFICE FOR:  Temperature above 101 degrees  Excessive bleeding or drainage on the dressing.  Excessive  swelling, coldness, or paleness of the toes.  Persistent nausea and vomiting.  FOLLOW-UP:   You should have an appointment to return to the office in 6 weeks after surgery.  Arrangements have been made for continuation of Physical Therapy (either home therapy or outpatient therapy).   POSTERIOR TOTAL HIP REPLACEMENT POSTOPERATIVE DIRECTIONS  Hip Rehabilitation, Guidelines Following Surgery  The results of a hip operation are greatly improved after range of motion and muscle strengthening exercises. Follow all safety measures which are given to protect your hip. If any of these exercises cause increased pain or swelling in your joint, decrease the amount until you are comfortable again. Then slowly increase the exercises. Call your caregiver if you have problems or questions.   HOME CARE INSTRUCTIONS  Remove items at home which could result in a fall. This includes throw rugs or furniture in walking pathways.   ICE to the affected hip every three hours for 30 minutes at a time and then as needed for pain and swelling.  Continue to use ice on the hip for pain and swelling from surgery. You may notice swelling that will progress down to the foot and ankle.  This is normal after surgery.  Elevate the leg when you are not up walking on it.    Continue to use the breathing machine which will help keep your temperature down.  It is common for your temperature to cycle up and down following surgery, especially at night when you are not up moving  around and exerting yourself.  The breathing machine keeps your lungs expanded and your temperature down.  DIET You may resume your previous home diet once your are discharged from the hospital.  DRESSING / WOUND  CARE / SHOWERING  DO NOT GET THE INCISION WET You may start showering once staples have been removed at 2 weeks. Change dressing as needed. Do not submerge the incision in water such as a bath tub, swimming pool or hot tubs until the incision  is completely healed which is approximately 4 weeks.  STAPLE REMOVAL: Please remove staple at 2 weeks post op and apply benzoin and 1/2 inch steri strips  ACTIVITY Walk with your walker as instructed. Use walker as long as suggested by your caregivers.May go to using a cane once your therapist feels that it is safe to do so. Avoid periods of inactivity such as sitting longer than an hour when not asleep. This helps prevent blood clots.  You may resume a sexual relationship in one month or when given the OK by your doctor.  You may return to work once you are cleared by your doctor.  Do not drive a car for 6 weeks or until released by you surgeon.  Do not drive while taking narcotics.   WEIGHT BEARING You may wait bear as tolerated on the surgical leg.  POSTOPERATIVE CONSTIPATION PROTOCOL Constipation - defined medically as fewer than three stools per week and severe constipation as less than one stool per week.  One of the most common issues patients have following surgery is constipation.  Even if you have a regular bowel pattern at home, your normal regimen is likely to be disrupted due to multiple reasons following surgery.  Combination of anesthesia, postoperative narcotics, change in appetite and fluid intake all can affect your bowels.  In order to avoid complications following surgery, here are some recommendations in order to help you during your recovery period.  Colace (docusate) - Pick up an over-the-counter form of Colace or another stool softener and take twice a day as long as you are requiring postoperative pain medications.  Take with a full glass of water daily.  If you experience loose stools or diarrhea, hold the colace until you stool forms back up.  If your symptoms do not get better within 1 week or if they get worse, check with your doctor.  Dulcolax (bisacodyl) - Pick up over-the-counter and take as directed by the product packaging as needed to assist with the  movement of your bowels.  Take with a full glass of water.  Use this product as needed if not relieved by Colace only.   MiraLax (polyethylene glycol) - Pick up over-the-counter to have on hand.  MiraLax is a solution that will increase the amount of water in your bowels to assist with bowel movements.  Take as directed and can mix with a glass of water, juice, soda, coffee, or tea.  Take if you go more than two days without a movement. Do not use MiraLax more than once per day. Call your doctor if you are still constipated or irregular after using this medication for 7 days in a row.  If you continue to have problems with postoperative constipation, please contact the office for further assistance and recommendations.  If you experience "the worst abdominal pain ever" or develop nausea or vomiting, please contact the office immediatly for further recommendations for treatment.  ITCHING  If you experience itching with your medications, try taking only a  single pain pill, or even half a pain pill at a time.  You can also use Benadryl over the counter for itching or also to help with sleep.   TED HOSE STOCKINGS Wear the elastic stockings on both legs. If you go home, you may remove these at night but will need to put them on the first thing in the morning. If you go to rehab, then you may remove them one hour per 8 hour shift. This is because in rehab you are not as active as you are at home.  MEDICATIONS See your medication summary on the After Visit Summary that the nursing staff will review with you prior to discharge.  You may have some home medications which will be placed on hold until you complete the course of blood thinner medication.  It is important for you to complete the blood thinner medication as prescribed by your surgeon.  Continue your approved medications as instructed at time of discharge.  PRECAUTIONS If you experience chest pain or shortness of breath - call 911 immediately for  transfer to the hospital emergency department.  If you develop a fever greater that 101 F, purulent drainage from wound, increased redness or drainage from wound, foul odor from the wound/dressing, or calf pain - CONTACT YOUR SURGEON.                                                   FOLLOW-UP APPOINTMENTS Make sure you keep all of your appointments after your operation with your surgeon and caregivers. You should call the office at the above phone number and make an appointment for approximately 6 weeks after the date of your surgery or on the date instructed by your surgeon outlined in the "After Visit Summary". This appointment should have already been made prior to the surgery. If you don't remember the date and time, call the office at [phone].  RANGE OF MOTION AND STRENGTHENING EXERCISES  These exercises are designed to help you keep full movement of your hip joint. Follow your caregiver's or physical therapist's instructions. Perform all exercises about fifteen times, three times per day or as directed. Exercise both hips, even if you have had only one joint replacement. These exercises can be done on a training (exercise) mat, on the floor, on a table or on a bed. Use whatever works the best and is most comfortable for you. Use music or television while you are exercising so that the exercises are a pleasant break in your day. This will make your life better with the exercises acting as a break in routine you can look forward to.  Lying on your back, slowly slide your foot toward your buttocks, raising your knee up off the floor. Then slowly slide your foot back down until your leg is straight again.  Lying on your back spread your legs as far apart as you can without causing discomfort.  Lying on your side, raise your upper leg and foot straight up from the floor as far as is comfortable. Slowly lower the leg and repeat.  Lying on your back, tighten up the muscle in the front of your thigh  (quadriceps muscles). You can do this by keeping your leg straight and trying to raise your heel off the floor. This helps strengthen the largest muscle supporting your knee.  Lying on  your back, tighten up the muscles of your buttocks both with the legs straight and with the knee bent at a comfortable angle while keeping your heel on the floor.   DON'T FORGET THE 4 POSTERIOR HIP PRECAUTIONS   IF YOU ARE TRANSFERRED TO A SKILLED REHAB FACILITY If the patient is transferred to a skilled rehab facility following release from the hospital, a list of the current medications will be sent to the facility for the patient to continue.  When discharged from the skilled rehab facility, please have the facility set up the patient's [place] prior to being released. Also, the skilled facility will be responsible for providing the patient with their medications at time of release from the facility to include their pain medication, the muscle relaxants, and their blood thinner medication. If the patient is still at the rehab facility at time of the two week follow up appointment, the skilled rehab facility will also need to assist the patient in arranging follow up appointment in our office and any transportation needs.  MAKE SURE YOU:  Understand these instructions.  Get help right away if you are not doing well or get worse.    Pick up stool softner and laxative for home use following surgery while on pain medications. Continue to use ice for pain and swelling after surgery. Do not use any lotions or creams on the incision until instructed by your surgeon.

## 2017-02-23 NOTE — Progress Notes (Addendum)
Subjective: 1 Day Post-Op Procedure(s) (LRB): TOTAL HIP ARTHROPLASTY (Left) Patient reports pain as mild.   Patient is well, and has had no acute complaints or problems Patient ambulated 5 feet yesterday. Did well. Plan is to go Rehab after hospital stay. no nausea and no vomiting Patient denies any chest pains or shortness of breath. Objective: Vital signs in last 24 hours: Temp:  [96.8 F (36 C)-98.1 F (36.7 C)] 98 F (36.7 C) (09/27 0046) Pulse Rate:  [52-71] 67 (09/27 0046) Resp:  [10-46] 18 (09/27 0046) BP: (117-170)/(64-89) 133/65 (09/27 0046) SpO2:  [88 %-100 %] 94 % (09/27 0046) FiO2 (%):  [98 %] 98 % (09/26 1236) well approximated incision Heels are non tender and elevated off the bed using rolled towels Intake/Output from previous day: 09/26 0701 - 09/27 0700 In: 3656.7 [P.O.:240; I.V.:2966.7; IV Piggyback:450] Out: 1885 [HYWVP:7106; Drains:220; Blood:175] Intake/Output this shift: No intake/output data recorded.   Recent Labs  02/23/17 0455  HGB 10.4*    Recent Labs  02/23/17 0455  WBC 7.7  RBC 3.31*  HCT 29.4*  PLT 182    Recent Labs  02/23/17 0455  NA 128*  K 4.3  CL 99*  CO2 23  BUN 24*  CREATININE 0.74  GLUCOSE 119*  CALCIUM 7.8*   No results for input(s): LABPT, INR in the last 72 hours.  EXAM General - Patient is Alert, Appropriate and Oriented Extremity - Neurologically intact Neurovascular intact Sensation intact distally Intact pulses distally Dorsiflexion/Plantar flexion intact No cellulitis present Compartment soft Dressing - dressing C/D/I Motor Function - intact, moving foot and toes well on exam. Able to do straight leg raise on his own  Past Medical History:  Diagnosis Date  . Arthritis   . Cancer [facility])    prostate  . Dyspnea   . Dysrhythmia    svt  . HOH (hard of hearing)   . Hypertension   . Hypothyroidism   . Melanoma ([place])   . Parkinson disease ([place])   . PONV (postoperative nausea and vomiting)   .  Sleep apnea    uses cpap  . Stroke [facility])    tia many many years ago.  Marland Kitchen SVT (supraventricular tachycardia) ([place])   . TIA (transient ischemic attack)     Assessment/Plan: 1 Day Post-Op Procedure(s) (LRB): TOTAL HIP ARTHROPLASTY (Left) Active Problems:   Status post total replacement of hip   Pressure injury of skin  Estimated body mass index is 25.1 kg/m as calculated from the following:   Height as of this encounter: 5\' 9"  (1.753 m).   Weight as of this encounter: 77.1 kg (170 lb). Advance diet Up with therapy D/C IV fluids Plan for discharge tomorrow Discharge to SNF  Labs: Were reviewed. Sodium 128 which is pretty much where he stays normally preadmission. DVT Prophylaxis - Lovenox, Foot Pumps and TED hose Weight-Bearing as tolerated to left leg D/C O2 and Pulse OX and try on Room Air Begin working on bowel movement  Jon R. [facility] PA [place] 02/23/2017, 7:38 AM   ADDENDUM: The nurse erroneously entered documentation of a pressure ulceration at admission without noting the location. Nor did she question the patient about the site. The nurse's documentation of a "problem" autopopulated the "Active Problems" list on Lonia Chimera note above.  The site in question was the right great toe. The patient's wife was caring for his nails 2-3 days before admission and caused a small bruise just medial to the nail. There was no  ulceration nor should this be considered a pressure ulcer/injury.  Azarius Lambson P. Holley Bouche M.D.

## 2017-02-24 LAB — CBC
HCT: 30.8 % — ABNORMAL LOW (ref 40.0–52.0)
Hemoglobin: 10.8 g/dL — ABNORMAL LOW (ref 13.0–18.0)
MCH: 30.5 pg (ref 26.0–34.0)
MCHC: 34.9 g/dL (ref 32.0–36.0)
MCV: 87.3 fL (ref 80.0–100.0)
PLATELETS: 187 10*3/uL (ref 150–440)
RBC: 3.53 MIL/uL — AB (ref 4.40–5.90)
RDW: 14.6 % — ABNORMAL HIGH (ref 11.5–14.5)
WBC: 9.9 10*3/uL (ref 3.8–10.6)

## 2017-02-24 LAB — BASIC METABOLIC PANEL
ANION GAP: 7 (ref 5–15)
BUN: 16 mg/dL (ref 6–20)
CO2: 24 mmol/L (ref 22–32)
Calcium: 8 mg/dL — ABNORMAL LOW (ref 8.9–10.3)
Chloride: 99 mmol/L — ABNORMAL LOW (ref 101–111)
Creatinine, Ser: 0.64 mg/dL (ref 0.61–1.24)
Glucose, Bld: 98 mg/dL (ref 65–99)
POTASSIUM: 3.6 mmol/L (ref 3.5–5.1)
SODIUM: 130 mmol/L — AB (ref 135–145)

## 2017-02-24 LAB — SURGICAL PATHOLOGY

## 2017-02-24 MED ORDER — LACTULOSE 10 GM/15ML PO SOLN
10.0000 g | Freq: Two times a day (BID) | ORAL | Status: DC | PRN
  Administered 2017-02-24: 10 g via ORAL
  Filled 2017-02-24: qty 30

## 2017-02-24 NOTE — Progress Notes (Addendum)
   Subjective: 2 Days Post-Op Procedure(s) (LRB): TOTAL HIP ARTHROPLASTY (Left) Patient reports pain as mild.   Patient is well, and has had no acute complaints or problems  Patient ambulated 160 ft yesterday. Plan is to go Home after hospital stay. no nausea and no vomiting Patient denies any chest pains or shortness of breath. No abdominal pain.  Objective: Vital signs in last 24 hours: Temp:  [98.5 F (36.9 C)-98.7 F (37.1 C)] 98.6 F (37 C) (09/27 2005) Pulse Rate:  [47-97] 92 (09/27 2005) Resp:  [16-18] 16 (09/27 2005) BP: (153-176)/(78-87) 154/83 (09/27 2005) SpO2:  [94 %-97 %] 95 % (09/27 2005) well approximated incision Heels are non tender and elevated off the bed using rolled towels Intake/Output from previous day: 09/27 0701 - 09/28 0700 In: 480 [P.O.:480] Out: 420 [Urine:350; Drains:70] Intake/Output this shift: No intake/output data recorded.   Recent Labs  02/23/17 0455 02/24/17 0432  HGB 10.4* 10.8*    Recent Labs  02/23/17 0455 02/24/17 0432  WBC 7.7 9.9  RBC 3.31* 3.53*  HCT 29.4* 30.8*  PLT 182 187    Recent Labs  02/23/17 0455 02/24/17 0432  NA 128* 130*  K 4.3 3.6  CL 99* 99*  CO2 23 24  BUN 24* 16  CREATININE 0.74 0.64  GLUCOSE 119* 98  CALCIUM 7.8* 8.0*   No results for input(s): LABPT, INR in the last 72 hours.  EXAM General - Patient is Alert, Appropriate and Oriented Extremity - Neurologically intact Neurovascular intact Sensation intact distally Intact pulses distally Dorsiflexion/Plantar flexion intact No cellulitis present Compartment soft  No edema. Dressing - dressing C/D/I. Hemovac removed. Motor Function - intact, moving foot and toes well on exam.   Past Medical History:  Diagnosis Date  . Arthritis   . Cancer [facility])    prostate  . Dyspnea   . Dysrhythmia    svt  . HOH (hard of hearing)   . Hypertension   . Hypothyroidism   . Melanoma ([place])   . Parkinson disease ([place])   . PONV (postoperative  nausea and vomiting)   . Sleep apnea    uses cpap  . Stroke [facility])    tia many many years ago.  Marland Kitchen SVT (supraventricular tachycardia) ([place])   . TIA (transient ischemic attack)     Assessment/Plan: 2 Days Post-Op Procedure(s) (LRB): TOTAL HIP ARTHROPLASTY (Left) Active Problems:   Status post total replacement of hip   Pressure injury of skin  Estimated body mass index is 25.1 kg/m as calculated from the following:   Height as of this encounter: 5\' 9"  (1.753 m).   Weight as of this encounter: 77.1 kg (170 lb). Advance diet Up with therapy Discharge home with home health, pending BM and completion of PT goals. Hyponatremia - stable at baseline, trending up. Vital signs stable Add Lactulose to bowel regimen.    DVT Prophylaxis - Lovenox, Foot Pumps and TED hose Weight-Bearing as tolerated to left leg D/C O2 and Pulse OX and try on [place] PA-C [place] 02/24/2017, 6:14 AM

## 2017-02-24 NOTE — Care Management (Signed)
I have notified Tim with [facility] of patient's discharge to home today. No other RNCM needs.

## 2017-02-24 NOTE — Progress Notes (Signed)
Patient is being discharged to home with [facility].  DC&RX instructions given and both patient and his daughter acknowledged understanding. No further questions. IV removed, belongings packed. NT to prepare patient for transport home with daughter.

## 2017-02-24 NOTE — Progress Notes (Signed)
Physical Therapy Treatment Patient Details Name: Duane Vasquez. MRN: [id number] DOB: 08-30-1925 Today's Date: 02/24/2017    History of Present Illness Ptis a 83 yo male with degenerative arthrosis of the L hip and is s/p L THA. Hx R THA in January. PMH includes: arthritis, prostate CA, SVT, HOH, HTN, hypothyroidism, melanoma, Parkinson's disease, and TIA.    PT Comments    Pt reported he is more sore and in pain compared to yesterday; baseline 6/10 pain compared to 4/10 pain yesterday. Pain monitored throughout session; session completed to pt's tolerance. Bed exercise repetitions were scaled back to 10 reps bilaterally. Bed mobility, transfers and ambulation required increased time and effort to perform. Pt required min assist for bed mobility and min assist-min guard x1 for sit to/from stand. Pt able to ambulate 174ft with min guard; gait speed decreased as compared to yesterday. Chair follow during gait, but was not needed. Pt was able to ascend/descend 1 step with  vc's for sequencing and min guard x1 while other PT held down walker for safety. Overall, pt and family seemed pleased with his ability to navigate the stairs as that was a main concern for returning home.   Follow Up Recommendations  Home health PT     Equipment Recommendations  None recommended by PT    Recommendations for Other Services       Precautions / Restrictions Precautions Precautions: Posterior Hip Precaution Comments: Pt able to recall 3/3 posterior THPs Restrictions Weight Bearing Restrictions: Yes LLE Weight Bearing: Weight bearing as tolerated    Mobility  Bed Mobility Overal bed mobility: Needs Assistance Bed Mobility: Supine to Sit     Supine to sit: Min assist;HOB elevated     General bed mobility comments: Min assist needed for moving LEs off of bed, scooting to EOB; vc's for hand placement and pushing through arms to rise trunk into sitting; increased time and effort  taken  Transfers Overall transfer level: Needs assistance Equipment used: Rolling walker (2 wheeled) Transfers: Sit to/from Stand Sit to Stand: Min assist         General transfer comment: Min Ax1 needed for first sit to stand trial, subsequent sit to stand trials required CGAx1. Vc's for LE/UE placement to maintain hip precaution; vc's to push through legs and arms to stand. Increased time and effort taken to perform  Ambulation/Gait Ambulation/Gait assistance: Min guard (chair follow, but not needed) Ambulation Distance (Feet): 150 Feet Assistive device: Rolling walker (2 wheeled) Gait Pattern/deviations: Step-to pattern;Decreased step length - right;Decreased stance time - left;Antalgic;Trunk flexed     General Gait Details: decreased gait speed compared to yesterday; vc's to keep RW close to body to help in offweighing LE's; vc's for turning L foot out prior to turning body while making L turns to maintain hip precautions   Stairs Stairs: Yes   Stair Management: No rails;Backwards Number of Stairs: 1 General stair comments: Instructed to ascent stairs backwards and descend forwards. Vc's for sequencing and +1 assist for holding walker down. Increased time and effort to perform, vc's for advancing foot forward enough while descending step as to clear entire step.  Wheelchair Mobility    Modified Rankin (Stroke Patients Only)       Balance Overall balance assessment: Needs assistance Sitting-balance support: No upper extremity supported;Feet supported Sitting balance-Leahy Scale: Good     Standing balance support: Bilateral upper extremity supported Standing balance-Leahy Scale: Poor  Cognition Arousal/Alertness: Awake/alert Behavior During Therapy: WFL for tasks assessed/performed Overall Cognitive Status: Within Functional Limits for tasks assessed                                        Exercises Total  Joint Exercises Ankle Circles/Pumps: Both;20 reps;Supine;AROM Quad Sets: Strengthening;Both;Supine;10 reps (10 reps x2) Gluteal Sets: Strengthening;Both;Supine;10 reps (10 reps x2) Hip ABduction/ADduction: AROM;Right;AAROM;Left;10 reps;Supine (10 reps x2) Long Arc Quad: AAROM;Both;10 reps;Seated (10 reps x2)    General Comments        Pertinent Vitals/Pain Pain Assessment: 0-10 Pain Score: 6  Pain Location: L hip Pain Descriptors / Indicators: Operative site guarding;Sore Pain Intervention(s): Limited activity within patient's tolerance;Monitored during session;Relaxation    Home Living                      Prior Function            PT Goals (current goals can now be found in the care plan section) Acute Rehab PT Goals Patient Stated Goal: To walk better and return home PT Goal Formulation: With patient Time For Goal Achievement: 03/07/17 Potential to Achieve Goals: Good Progress towards PT goals: Progressing toward goals    Frequency    BID      PT Plan Current plan remains appropriate    Co-evaluation              AM-PAC PT "6 Clicks" Daily Activity  Outcome Measure  Difficulty turning over in bed (including adjusting bedclothes, sheets and blankets)?: A Little Difficulty moving from lying on back to sitting on the side of the bed? : Unable Difficulty sitting down on and standing up from a chair with arms (e.g., wheelchair, bedside commode, etc,.)?: Unable Help needed moving to and from a bed to chair (including a wheelchair)?: A Little Help needed walking in hospital room?: A Little Help needed climbing 3-5 steps with a railing? : A Little 6 Click Score: 14    End of Session Equipment Utilized During Treatment: Gait belt Activity Tolerance: Patient tolerated treatment well Patient left: in chair;with call bell/phone within reach;with chair alarm set;with family/visitor present;with SCD's reapplied (Pillows between legs, heels  elevated) Nurse Communication: Mobility status PT Visit Diagnosis: Other abnormalities of gait and mobility (R26.89);Muscle weakness (generalized) (M62.81)     Time: 1120-1203 PT Time Calculation (min) (ACUTE ONLY): 43 min  Charges:                       G CodesWetzel Bjornstad, SPT 02/24/2017, 12:22 PM

## 2018-03-13 ENCOUNTER — Ambulatory Visit: Payer: Medicare Other | Admitting: Podiatry

## 2018-03-20 ENCOUNTER — Encounter: Payer: Self-pay | Admitting: Podiatry

## 2018-03-20 ENCOUNTER — Ambulatory Visit (INDEPENDENT_AMBULATORY_CARE_PROVIDER_SITE_OTHER): Payer: Medicare Other | Admitting: Podiatry

## 2018-03-20 DIAGNOSIS — B351 Tinea unguium: Secondary | ICD-10-CM | POA: Diagnosis not present

## 2018-03-20 DIAGNOSIS — M79676 Pain in unspecified toe(s): Secondary | ICD-10-CM | POA: Diagnosis not present

## 2018-03-25 NOTE — Progress Notes (Signed)
   SUBJECTIVE Patient presents to office today complaining of elongated, thickened nails that cause pain while ambulating in shoes. He is unable to trim his own nails. Patient is here for further evaluation and treatment.  Past Medical History:  Diagnosis Date  . Arthritis   . Cancer [facility])    prostate  . Dyspnea   . Dysrhythmia    svt  . HOH (hard of hearing)   . Hypertension   . Hypothyroidism   . Melanoma ([place])   . Parkinson disease ([place])   . PONV (postoperative nausea and vomiting)   . Sleep apnea    uses cpap  . Stroke [facility])    tia many many years ago.  Marland Kitchen SVT (supraventricular tachycardia) ([place])   . TIA (transient ischemic attack)     OBJECTIVE General Patient is awake, alert, and oriented x 3 and in no acute distress. Derm Skin is dry and supple bilateral. Negative open lesions or macerations. Remaining integument unremarkable. Nails are tender, long, thickened and dystrophic with subungual debris, consistent with onychomycosis, 1-5 bilateral. No signs of infection noted. Vasc  DP and PT pedal pulses palpable bilaterally. Temperature gradient within normal limits.  Neuro Epicritic and protective threshold sensation grossly intact bilaterally.  Musculoskeletal Exam No symptomatic pedal deformities noted bilateral. Muscular strength within normal limits.  ASSESSMENT 1. Onychodystrophic nails 1-5 bilateral with hyperkeratosis of nails.  2. Onychomycosis of nail due to dermatophyte bilateral 3. Pain in foot bilateral  PLAN OF CARE 1. Patient evaluated today.  2. Instructed to maintain good pedal hygiene and foot care.  3. Mechanical debridement of nails 1-5 bilaterally performed using a nail nipper. Filed with dremel without incident.  4. Return to clinic in 3 mos.    Edrick Kins, DPM [facility]  Dr. Edrick Kins, [address]                                        [place], [place] [address]                Office [phone]  Fax  [phone]

## 2018-06-21 ENCOUNTER — Ambulatory Visit: Payer: Medicare Other | Admitting: Podiatry

## 2018-07-19 ENCOUNTER — Ambulatory Visit: Payer: Medicare Other | Admitting: Podiatry

## 2018-07-30 ENCOUNTER — Ambulatory Visit (INDEPENDENT_AMBULATORY_CARE_PROVIDER_SITE_OTHER): Payer: Medicare Other | Admitting: Podiatry

## 2018-07-30 ENCOUNTER — Encounter: Payer: Self-pay | Admitting: Podiatry

## 2018-07-30 DIAGNOSIS — B351 Tinea unguium: Secondary | ICD-10-CM | POA: Diagnosis not present

## 2018-07-30 DIAGNOSIS — M79676 Pain in unspecified toe(s): Secondary | ICD-10-CM | POA: Diagnosis not present

## 2018-07-30 NOTE — Progress Notes (Addendum)
Complaint:  Visit Type: Patient returns to my office for continued preventative foot care services. Complaint: Patient states" my nails have grown long and thick and become painful to walk and wear shoes" . The patient presents for preventative foot care services. No changes to ROS  Podiatric Exam: Vascular: dorsalis pedis and posterior tibial pulses are palpable bilateral. Capillary return is immediate. Temperature gradient is WNL. Skin turgor WNL  Sensorium: Normal Semmes Weinstein monofilament test. Normal tactile sensation bilaterally. Nail Exam: Pt has thick disfigured discolored nails with subungual debris noted bilateral entire nail hallux through fifth toenails Ulcer Exam: There is no evidence of ulcer or pre-ulcerative changes or infection. Orthopedic Exam: Muscle tone and strength are WNL. No limitations in general ROM. No crepitus or effusions noted. Foot type and digits show no abnormalities.  Skin: No Porokeratosis. No infection or ulcers  Diagnosis:  Onychomycosis, , Pain in right toe, pain in left toes  Treatment & Plan Procedures and Treatment: Consent by patient was obtained for treatment procedures.   Debridement of mycotic and hypertrophic toenails, 1 through 5 bilateral and clearing of subungual debris. No ulceration, no infection noted.  Return Visit-Office Procedure: Patient instructed to return to the office for a follow up visit prn for continued evaluation and treatment.    Gardiner Barefoot DPM

## 2019-08-26 ENCOUNTER — Encounter: Payer: Self-pay | Admitting: Podiatry

## 2019-08-26 ENCOUNTER — Ambulatory Visit (INDEPENDENT_AMBULATORY_CARE_PROVIDER_SITE_OTHER): Payer: Medicare Other | Admitting: Podiatry

## 2019-08-26 ENCOUNTER — Other Ambulatory Visit: Payer: Self-pay

## 2019-08-26 VITALS — Temp 97.7°F

## 2019-08-26 DIAGNOSIS — B351 Tinea unguium: Secondary | ICD-10-CM | POA: Diagnosis not present

## 2019-08-26 DIAGNOSIS — M79676 Pain in unspecified toe(s): Secondary | ICD-10-CM

## 2019-08-26 NOTE — Progress Notes (Signed)
This patient returns to the office for evaluation and treatment of long thick painful nails .  This patient is unable to trim his own nails since the patient cannot reach the feet.  Patient says the nails are painful walking and wearing his shoes.  He returns for preventive foot care services.  General Appearance  Alert, conversant and in no acute stress.  Vascular  Dorsalis pedis and posterior tibial  pulses are palpable  bilaterally.  Capillary return is within normal limits  bilaterally. Temperature is within normal limits  bilaterally.  Neurologic  Senn-Weinstein monofilament wire test within normal limits  bilaterally. Muscle power within normal limits bilaterally.  Nails Thick disfigured discolored nails with subungual debris  from hallux to fifth toes bilaterally. No evidence of bacterial infection or drainage bilaterally.  Orthopedic  No limitations of motion  feet .  No crepitus or effusions noted.  No bony pathology or digital deformities noted.  Skin  normotropic skin with no porokeratosis noted bilaterally.  No signs of infections or ulcers noted.     Onychomycosis  Pain in toes right foot  Pain in toes left foot  Debridement  of nails  1-5  B/L with a nail nipper.  Nails were then filed using a dremel tool with no incidents.    RTC  prn   Uday Jantz DPM  

## 2020-02-24 ENCOUNTER — Other Ambulatory Visit: Payer: Self-pay

## 2020-02-24 ENCOUNTER — Encounter: Payer: Self-pay | Admitting: Podiatry

## 2020-02-24 ENCOUNTER — Ambulatory Visit (INDEPENDENT_AMBULATORY_CARE_PROVIDER_SITE_OTHER): Payer: Medicare Other | Admitting: Podiatry

## 2020-02-24 DIAGNOSIS — B351 Tinea unguium: Secondary | ICD-10-CM | POA: Diagnosis not present

## 2020-02-24 DIAGNOSIS — M79676 Pain in unspecified toe(s): Secondary | ICD-10-CM | POA: Diagnosis not present

## 2020-02-24 DIAGNOSIS — G629 Polyneuropathy, unspecified: Secondary | ICD-10-CM | POA: Diagnosis not present

## 2020-02-24 NOTE — Progress Notes (Signed)
This patient returns to the office for evaluation and treatment of long thick painful nails .  This patient is unable to trim his own nails since the patient cannot reach the feet.  Patient says the nails are painful walking and wearing his shoes.  He returns for preventive foot care services.  General Appearance  Alert, conversant and in no acute stress.  Vascular  Dorsalis pedis and posterior tibial  pulses are palpable  bilaterally.  Capillary return is within normal limits  bilaterally. Temperature is within normal limits  bilaterally.  Neurologic  Senn-Weinstein monofilament wire test within normal limits  bilaterally. Muscle power within normal limits bilaterally.  Nails Thick disfigured discolored nails with subungual debris  from hallux to fifth toes bilaterally. No evidence of bacterial infection or drainage bilaterally.  Orthopedic  No limitations of motion  feet .  No crepitus or effusions noted.  No bony pathology or digital deformities noted.  Skin  normotropic skin with no porokeratosis noted bilaterally.  No signs of infections or ulcers noted.     Onychomycosis  Pain in toes right foot  Pain in toes left foot  Debridement  of nails  1-5  B/L with a nail nipper.  Nails were then filed using a dremel tool with no incidents.    RTC   4 months    Gardiner Barefoot DPM

## 2020-09-24 ENCOUNTER — Ambulatory Visit (INDEPENDENT_AMBULATORY_CARE_PROVIDER_SITE_OTHER): Payer: Medicare Other | Admitting: Podiatry

## 2020-09-24 ENCOUNTER — Other Ambulatory Visit: Payer: Self-pay

## 2020-09-24 ENCOUNTER — Encounter: Payer: Self-pay | Admitting: Podiatry

## 2020-09-24 DIAGNOSIS — M79676 Pain in unspecified toe(s): Secondary | ICD-10-CM | POA: Diagnosis not present

## 2020-09-24 DIAGNOSIS — G629 Polyneuropathy, unspecified: Secondary | ICD-10-CM

## 2020-09-24 DIAGNOSIS — B351 Tinea unguium: Secondary | ICD-10-CM

## 2020-09-24 NOTE — Progress Notes (Signed)
This patient returns to the office for evaluation and treatment of long thick painful nails .  This patient is unable to trim his own nails since the patient cannot reach the feet.  Patient says the nails are painful walking and wearing his shoes.  He returns for preventive foot care services.  General Appearance  Alert, conversant and in no acute stress.  Vascular  Dorsalis pedis and posterior tibial  pulses are palpable  bilaterally.  Capillary return is within normal limits  bilaterally. Temperature is within normal limits  bilaterally.  Neurologic  Senn-Weinstein monofilament wire test within normal limits  bilaterally. Muscle power within normal limits bilaterally.  Nails Thick disfigured discolored nails with subungual debris  from hallux to fifth toes bilaterally. No evidence of bacterial infection or drainage bilaterally.  Orthopedic  No limitations of motion  feet .  No crepitus or effusions noted.  No bony pathology or digital deformities noted.  Skin  normotropic skin with no porokeratosis noted bilaterally.  No signs of infections or ulcers noted.     Onychomycosis  Pain in toes right foot  Pain in toes left foot  Debridement  of nails  1-5  B/L with a nail nipper.  Nails were then filed using a dremel tool with no incidents.    RTC   4 months    Gardiner Barefoot DPM

## 2021-01-28 ENCOUNTER — Other Ambulatory Visit: Payer: Self-pay

## 2021-01-28 ENCOUNTER — Encounter: Payer: Self-pay | Admitting: Podiatry

## 2021-01-28 ENCOUNTER — Ambulatory Visit (INDEPENDENT_AMBULATORY_CARE_PROVIDER_SITE_OTHER): Payer: Medicare Other | Admitting: Podiatry

## 2021-01-28 DIAGNOSIS — G629 Polyneuropathy, unspecified: Secondary | ICD-10-CM

## 2021-01-28 DIAGNOSIS — B351 Tinea unguium: Secondary | ICD-10-CM

## 2021-01-28 DIAGNOSIS — M79676 Pain in unspecified toe(s): Secondary | ICD-10-CM

## 2021-01-28 NOTE — Progress Notes (Signed)
This patient returns to the office for evaluation and treatment of long thick painful nails .  This patient is unable to trim his own nails since the patient cannot reach the feet.  Patient says the nails are painful walking and wearing his shoes.  He returns for preventive foot care services.  General Appearance  Alert, conversant and in no acute stress.  Vascular  Dorsalis pedis and posterior tibial  pulses are palpable  bilaterally.  Capillary return is within normal limits  bilaterally. Temperature is within normal limits  bilaterally.  Neurologic  Senn-Weinstein monofilament wire test within normal limits  bilaterally. Muscle power within normal limits bilaterally.  Nails Thick disfigured discolored nails with subungual debris  from hallux to fifth toes bilaterally. No evidence of bacterial infection or drainage bilaterally.  Orthopedic  No limitations of motion  feet .  No crepitus or effusions noted.  No bony pathology or digital deformities noted.  Skin  normotropic skin with no porokeratosis noted bilaterally.  No signs of infections or ulcers noted.     Onychomycosis  Pain in toes right foot  Pain in toes left foot  Debridement  of nails  1-5  B/L with a nail nipper.  Nails were then filed using a dremel tool with no incidents.    RTC   4 months    Gardiner Barefoot DPM

## 2021-05-27 ENCOUNTER — Ambulatory Visit: Payer: Medicare Other | Admitting: Podiatry

## 2021-06-10 ENCOUNTER — Encounter: Payer: Self-pay | Admitting: Podiatry

## 2021-06-10 ENCOUNTER — Other Ambulatory Visit: Payer: Self-pay

## 2021-06-10 ENCOUNTER — Ambulatory Visit (INDEPENDENT_AMBULATORY_CARE_PROVIDER_SITE_OTHER): Payer: Medicare Other | Admitting: Podiatry

## 2021-06-10 DIAGNOSIS — B351 Tinea unguium: Secondary | ICD-10-CM | POA: Diagnosis not present

## 2021-06-10 DIAGNOSIS — G629 Polyneuropathy, unspecified: Secondary | ICD-10-CM

## 2021-06-10 DIAGNOSIS — M79676 Pain in unspecified toe(s): Secondary | ICD-10-CM

## 2021-06-10 NOTE — Progress Notes (Signed)
This patient returns to the office for evaluation and treatment of long thick painful nails .  This patient is unable to trim his own nails since the patient cannot reach the feet.  Patient says the nails are painful walking and wearing his shoes.  He returns for preventive foot care services.  General Appearance  Alert, conversant and in no acute stress.  Vascular  Dorsalis pedis and posterior tibial  pulses are palpable  bilaterally.  Capillary return is within normal limits  bilaterally. Temperature is within normal limits  bilaterally.  Neurologic  Senn-Weinstein monofilament wire test within normal limits  bilaterally. Muscle power within normal limits bilaterally.  Nails Thick disfigured discolored nails with subungual debris  from hallux to fifth toes bilaterally. No evidence of bacterial infection or drainage bilaterally.  Orthopedic  No limitations of motion  feet .  No crepitus or effusions noted.  No bony pathology or digital deformities noted.  Skin  normotropic skin with no porokeratosis noted bilaterally.  No signs of infections or ulcers noted.     Onychomycosis  Pain in toes right foot  Pain in toes left foot  Plantar fasciitis right  Debridement  of nails  1-5  B/L with a nail nipper.  Nails were then filed using a dremel tool with no incidents.  Patient says he has occasional right heel pain  Told to take advil or acetominophen  RTC   4 months    Gardiner Barefoot DPM

## 2021-10-14 ENCOUNTER — Encounter: Payer: Self-pay | Admitting: Podiatry

## 2021-10-14 ENCOUNTER — Ambulatory Visit (INDEPENDENT_AMBULATORY_CARE_PROVIDER_SITE_OTHER): Payer: Medicare Other | Admitting: Podiatry

## 2021-10-14 DIAGNOSIS — G629 Polyneuropathy, unspecified: Secondary | ICD-10-CM

## 2021-10-14 DIAGNOSIS — M79676 Pain in unspecified toe(s): Secondary | ICD-10-CM | POA: Diagnosis not present

## 2021-10-14 DIAGNOSIS — B351 Tinea unguium: Secondary | ICD-10-CM

## 2021-10-14 NOTE — Progress Notes (Signed)
This patient returns to the office for evaluation and treatment of long thick painful nails .  This patient is unable to trim his own nails since the patient cannot reach the feet.  Patient says the nails are painful walking and wearing his shoes.  He returns for preventive foot care services.  General Appearance  Alert, conversant and in no acute stress.  Vascular  Dorsalis pedis and posterior tibial  pulses are palpable  bilaterally.  Capillary return is within normal limits  bilaterally. Temperature is within normal limits  bilaterally.  Neurologic  Senn-Weinstein monofilament wire test within normal limits  bilaterally. Muscle power within normal limits bilaterally.  Nails Thick disfigured discolored nails with subungual debris  from hallux to fifth toes bilaterally. No evidence of bacterial infection or drainage bilaterally.  Orthopedic  No limitations of motion  feet .  No crepitus or effusions noted.  No bony pathology or digital deformities noted.  Skin  normotropic skin with no porokeratosis noted bilaterally.  No signs of infections or ulcers noted.     Onychomycosis  Pain in toes right foot  Pain in toes left foot  Plantar fasciitis right  Debridement  of nails  1-5  B/L with a nail nipper.  Nails were then filed using a dremel tool with no incidents.  Patient says he has occasional right heel pain  Told to take advil or acetominophen  RTC   4 months    Gardiner Barefoot DPM

## 2021-12-28 DEATH — deceased

## 2022-02-17 ENCOUNTER — Ambulatory Visit: Payer: Medicare Other | Admitting: Podiatry
